# Patient Record
Sex: Female | Born: 1975
Health system: Southern US, Community
[De-identification: ages and names within clinical notes are randomized; demographics above are authoritative.]

## PROBLEM LIST (undated history)

## (undated) ENCOUNTER — Emergency Department (HOSPITAL_BASED_OUTPATIENT_CLINIC_OR_DEPARTMENT_OTHER): Admission: EM | Payer: Self-pay | Source: Home / Self Care

## (undated) DIAGNOSIS — E785 Hyperlipidemia, unspecified: Secondary | ICD-10-CM

## (undated) DIAGNOSIS — E0500 Thyrotoxicosis with diffuse goiter without thyrotoxic crisis or storm: Secondary | ICD-10-CM

## (undated) DIAGNOSIS — R0683 Snoring: Secondary | ICD-10-CM

## (undated) DIAGNOSIS — R5383 Other fatigue: Secondary | ICD-10-CM

## (undated) DIAGNOSIS — E039 Hypothyroidism, unspecified: Secondary | ICD-10-CM

## (undated) DIAGNOSIS — I10 Essential (primary) hypertension: Secondary | ICD-10-CM

## (undated) DIAGNOSIS — F32A Depression, unspecified: Secondary | ICD-10-CM

## (undated) DIAGNOSIS — F329 Major depressive disorder, single episode, unspecified: Secondary | ICD-10-CM

## (undated) DIAGNOSIS — F419 Anxiety disorder, unspecified: Secondary | ICD-10-CM

## (undated) HISTORY — DX: Essential (primary) hypertension: I10

## (undated) HISTORY — DX: Hypothyroidism, unspecified: E03.9

## (undated) HISTORY — DX: Thyrotoxicosis with diffuse goiter without thyrotoxic crisis or storm: E05.00

## (undated) HISTORY — DX: Snoring: R06.83

## (undated) HISTORY — DX: Other fatigue: R53.83

## (undated) HISTORY — DX: Hyperlipidemia, unspecified: E78.5

## (undated) HISTORY — DX: Depression, unspecified: F32.A

## (undated) HISTORY — DX: Anxiety disorder, unspecified: F41.9

---
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
## 1999-03-21 ENCOUNTER — Other Ambulatory Visit: Admission: RE | Admit: 1999-03-21 | Discharge: 1999-03-21 | Payer: Self-pay | Admitting: Obstetrics and Gynecology

## 2000-12-29 ENCOUNTER — Other Ambulatory Visit: Admission: RE | Admit: 2000-12-29 | Discharge: 2000-12-29 | Payer: Self-pay | Admitting: Obstetrics and Gynecology

## 2001-05-21 ENCOUNTER — Inpatient Hospital Stay (HOSPITAL_COMMUNITY): Admission: AD | Admit: 2001-05-21 | Discharge: 2001-05-21 | Payer: Self-pay | Admitting: Obstetrics & Gynecology

## 2001-08-02 ENCOUNTER — Inpatient Hospital Stay (HOSPITAL_COMMUNITY): Admission: AD | Admit: 2001-08-02 | Discharge: 2001-08-04 | Payer: Self-pay | Admitting: Obstetrics and Gynecology

## 2001-09-13 ENCOUNTER — Other Ambulatory Visit: Admission: RE | Admit: 2001-09-13 | Discharge: 2001-09-13 | Payer: Self-pay | Admitting: Obstetrics & Gynecology

## 2003-09-07 ENCOUNTER — Inpatient Hospital Stay (HOSPITAL_COMMUNITY): Admission: AD | Admit: 2003-09-07 | Discharge: 2003-09-07 | Payer: Self-pay | Admitting: Gynecology

## 2003-09-08 ENCOUNTER — Ambulatory Visit (HOSPITAL_COMMUNITY): Admission: RE | Admit: 2003-09-08 | Discharge: 2003-09-08 | Payer: Self-pay | Admitting: *Deleted

## 2003-09-21 ENCOUNTER — Encounter: Admission: RE | Admit: 2003-09-21 | Discharge: 2003-09-21 | Payer: Self-pay | Admitting: Obstetrics and Gynecology

## 2005-03-28 IMAGING — US US OB COMP LESS 14 WK
1 series · 18 of 28 positions shown · non-contrast
Comparison: none

CLINICAL DATA: 14 weeks estimated gestational age with spotting.  
EARLY OBSTETRICAL ULTRASOUND WITH TRANSVAGINAL:
Multiple images of the uterus and adnexa were obtained using a transabdominal and endovaginal approaches. 
There is an intrauterine gestational sac identified which has a sac size correlating with a 9 week 1 day gestation.  No evidence for yolk sac or fetal pole is seen and both should be easily visualized at the mean sac diameter of 3.6 cm noted today.  Findings are compatible with a blighted ovum.  
Both ovaries are seen and have a normal appearance with the right ovary measuring 2.0 x 2.1 x 1.6 cm and the left ovary measuring 2.5 x 1.5 x 2.3 cm.  No cul-de-sac or periovarian fluid is seen and no separate adnexal masses are noted.  
IMPRESSION
Findings compatible with a blighted ovum.

[Series 1: us ob comp<14 wk · 18 of 40 slices shown]
[im 1/40]
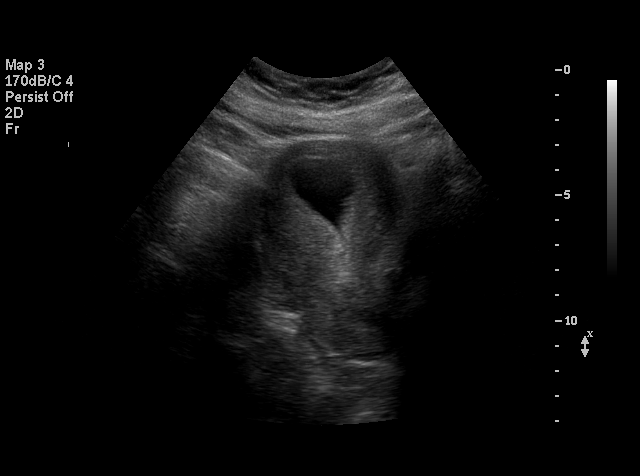
[im 3/40]
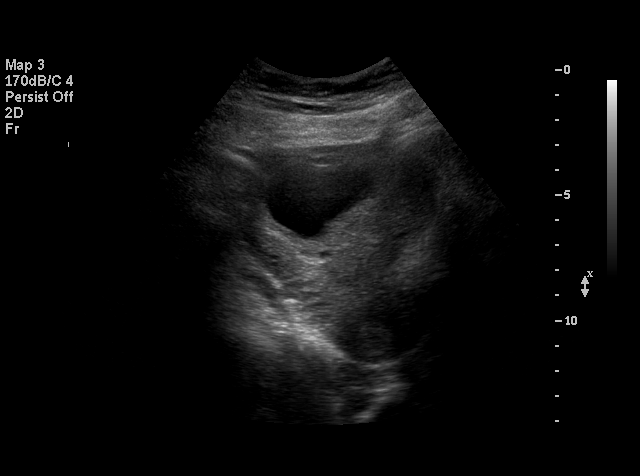
[im 5/40]
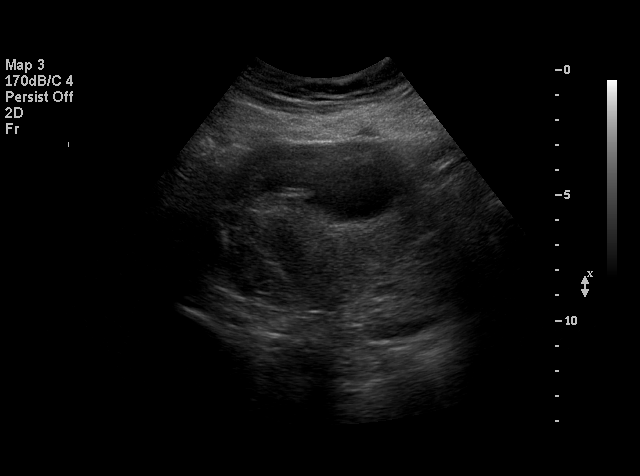
[im 8/40]
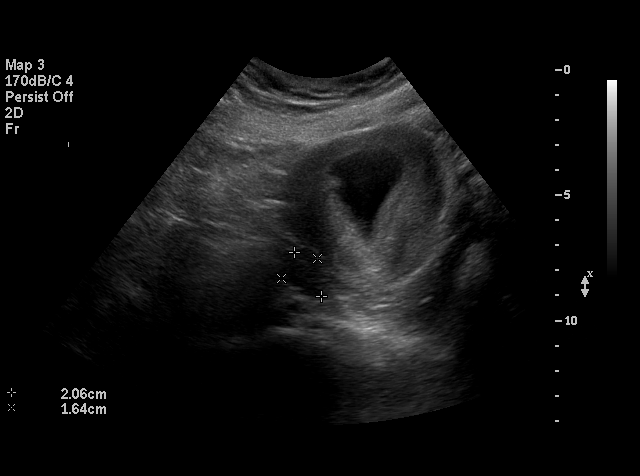
[im 11/40]
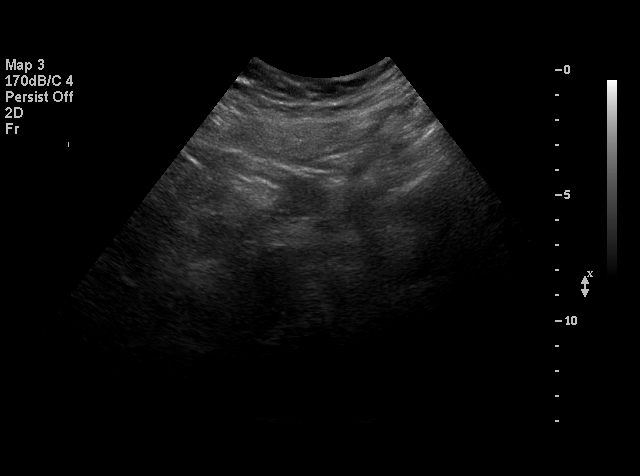
[im 12/40]
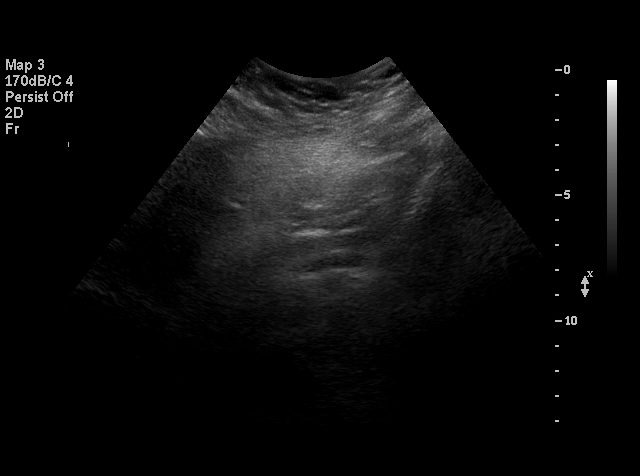
[im 15/40]
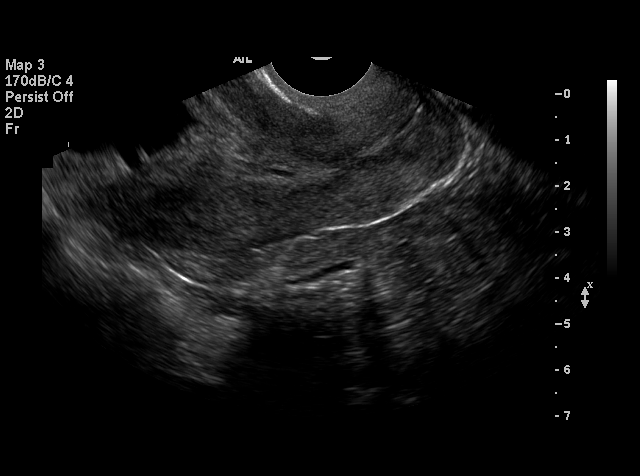
[im 16/40]
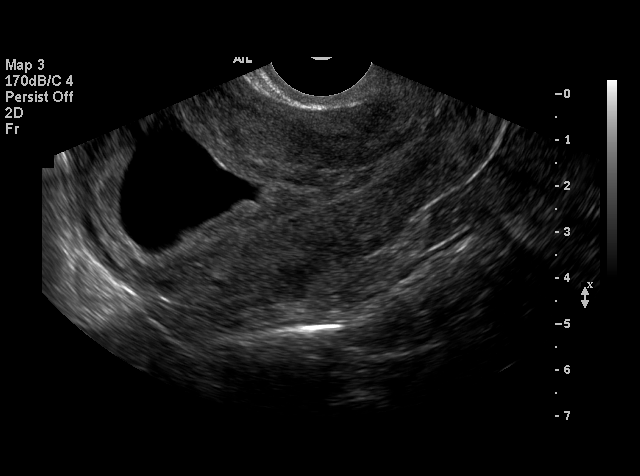
[im 19/40]
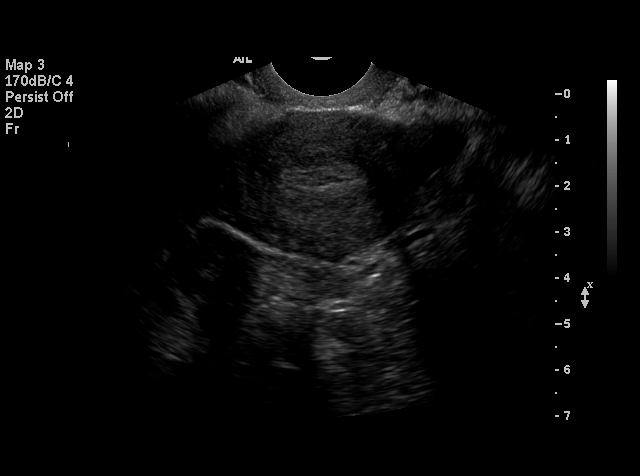
[im 21/40]
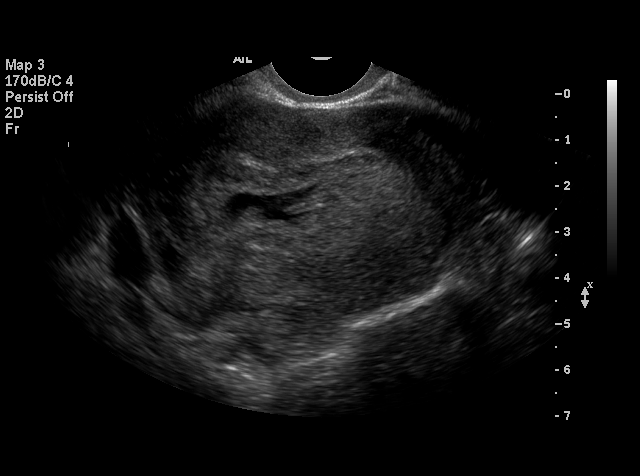
[im 24/40]
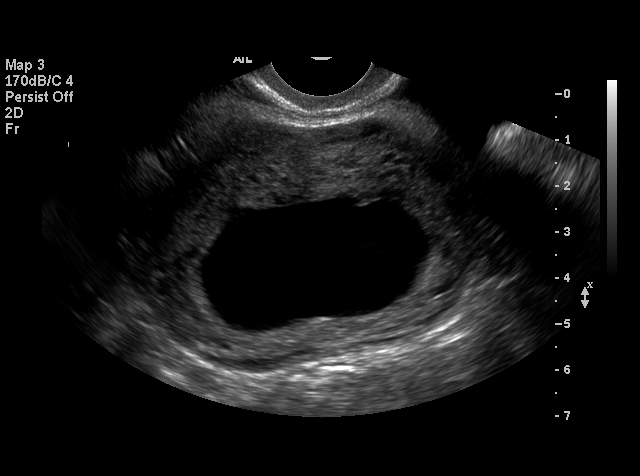
[im 25/40]
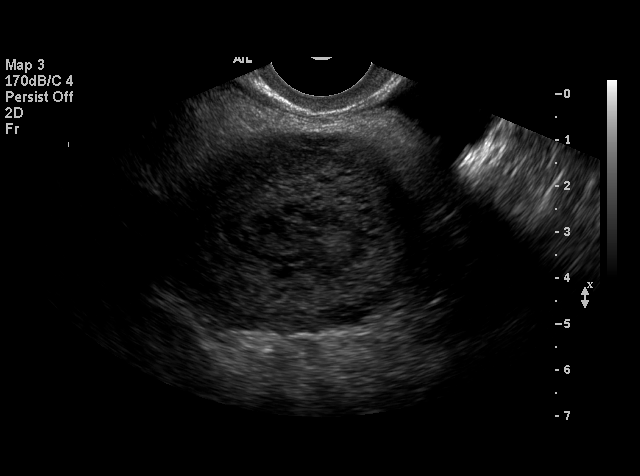
[im 28/40]
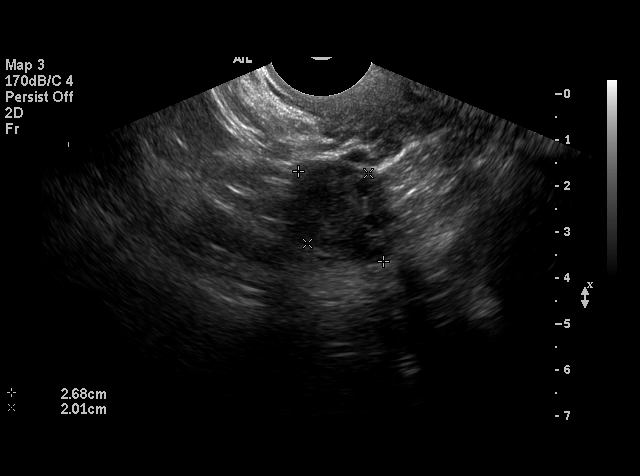
[im 31/40]
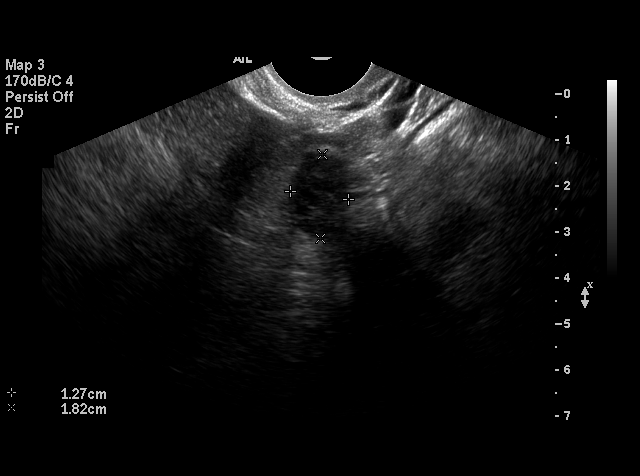
[im 32/40]
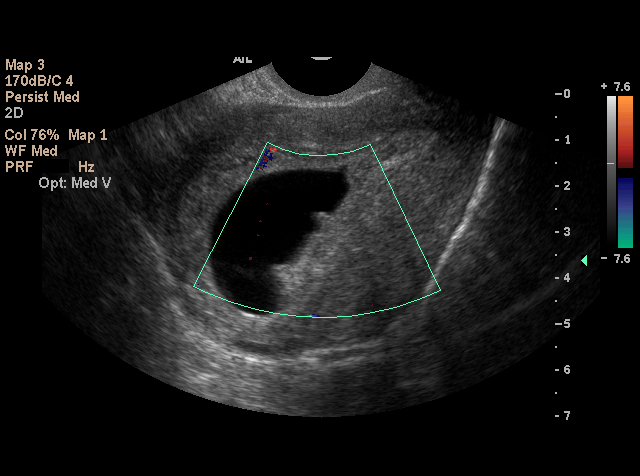
[im 35/40]
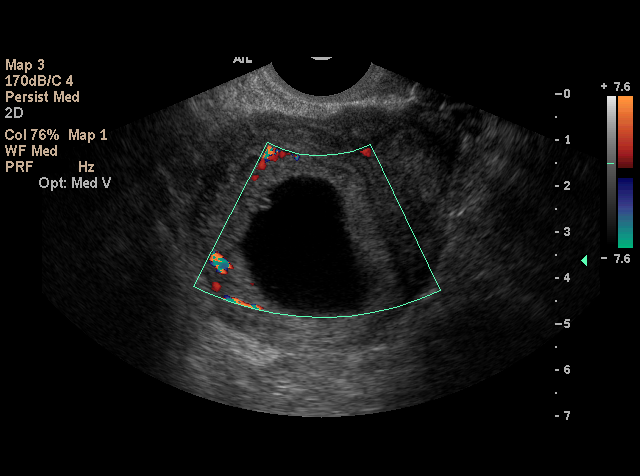
[im 37/40]
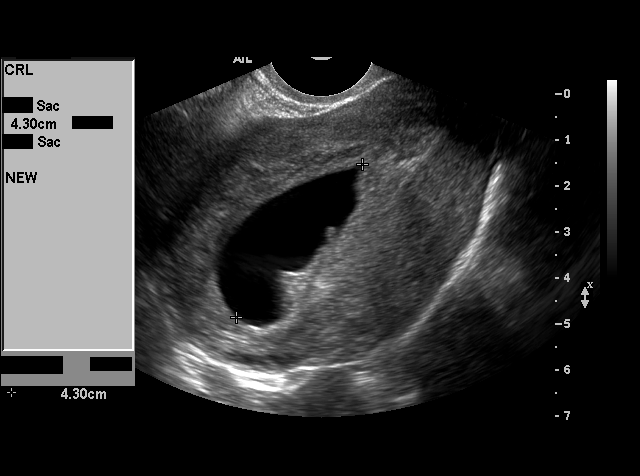
[im 40/40]
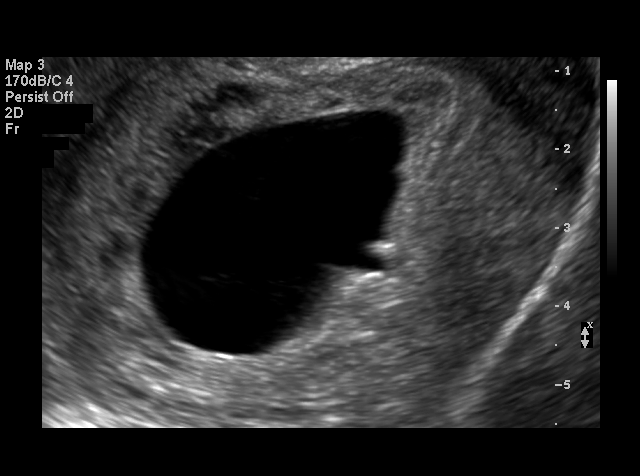

[18 of 28 positions shown; findings below may reference images not displayed]

## 2015-07-16 ENCOUNTER — Ambulatory Visit (INDEPENDENT_AMBULATORY_CARE_PROVIDER_SITE_OTHER): Payer: BLUE CROSS/BLUE SHIELD | Admitting: Neurology

## 2015-07-16 ENCOUNTER — Encounter: Payer: Self-pay | Admitting: Neurology

## 2015-07-16 VITALS — BP 130/92 | HR 82 | Resp 20 | Ht 61.0 in | Wt 224.0 lb

## 2015-07-16 DIAGNOSIS — E785 Hyperlipidemia, unspecified: Secondary | ICD-10-CM | POA: Insufficient documentation

## 2015-07-16 DIAGNOSIS — R351 Nocturia: Secondary | ICD-10-CM

## 2015-07-16 DIAGNOSIS — G4710 Hypersomnia, unspecified: Secondary | ICD-10-CM | POA: Diagnosis not present

## 2015-07-16 DIAGNOSIS — R0683 Snoring: Secondary | ICD-10-CM | POA: Diagnosis not present

## 2015-07-16 DIAGNOSIS — G4730 Sleep apnea, unspecified: Secondary | ICD-10-CM

## 2015-07-16 NOTE — Progress Notes (Signed)
[facility]   Provider:  Melvyn Novas, M D  Referring Provider: Geoffry Paradise, MD Primary Care Physician:  Samuel Jester, DO  Chief Complaint  Patient presents with  . New Patient (Initial Visit)    snoring, never had sleep study, rm 11, alone    HPI:  Angela Newman is a 40 y.o. female , seen here as a referral from Dr. Jacky Kindle for a sleep consultation,   Angela Newman is a right-handed Caucasian female patient of Dr. Lanell Matar, by whom she is followed for her thyroid disease. She is complaining of snoring and nonrestorative sleep and has a body mass index exceeding 35. She quit smoking in 2015, does drink up to 3 caffeinated sodas per day, denies any recreational drug use and alcohol use. She is not longer exposed to passive smoke . Her medical history includes Graves' thyroid disease, obesity, there is no surgical history,  she has a maternal history of headaches and hypertension maternal great grandmother with goiter paternal grandmother with history of cancer, strokes, diabetes heart attack and hypertension paternal grandfather with history of cancer. She is not employed since 2015, only in these circumstances could she finally quit smoking. She has looked for new work.   She is married with 2 children.  Her oldest son is 88, her daughter is in school, 37 years old. Urinary urgency and frequency started during the pregnancy was her second child, a daughter, about 14 years ago. Both children were born after an uneventful pregnancy by vaginal delivery. Chief complaint according to patient :She works out every day 30-45 minutes and feels right after very sleepy, sluggish.  " I am yawning while on the treat mill! "  Sleep habits are as follows: She usually goes to bed at 2300 hrs., falling asleep very promptly. The bedroom is cool quiet and dark, she shares a bedroom with her husband. Her husband has noted her to snore and has witnessed her to breathe irregularly. She sleeps  on her side, she rests her head on one pillow and chooses two other pillows to position herself. Her husband watched TV in the bedroom. The cable goes out at 3 AM and she wakes to shut the TV down.  Once asleep she will wake up every 2 hours to urinate. She does not recall waking for any other reasons than for bathroom break. She feels she goes right back to sleep, yet her sleep is fragmented. She rises in the morning at 6:30 AM and if she wakes up at 5 AM this nocturia she has trouble going back to sleep after that. She will have 4-5 bathroom breaks at night. She wakes without headaches but with a dry mouth. She has urinary frequency during the day to reports that the bladder volume is not reduced, she usually feels that her bladder was emptied conmpletely. She has not been diagnosed with diabetes but with thyroid condition. The urinary frequency has been present for years. The patient reports that she often wakes up just before the alarm will ring but she uses the alarm as a safety measure. She uses the alarm on her phone. When she rises she will drink some pepsi, she usually eats oatmeal in the morning and sometimes bacon and eggs. She is currently not employed outside the home.  Sleep medical history and family sleep history: the patient never had a problem to initiate sleep and maintain sleep, no history of sleepwalking, night terrors or enuresis Social history: see above   Review of  Systems: Out of a complete 14 system review, the patient complains of only the following symptoms, and all other reviewed systems are negative. How likely are you to doze in the following situations: 0 = not likely, 1 = slight chance, 2 = moderate chance, 3 = high chance  Sitting and Reading?2 Watching Television?2 Sitting inactive in a public place (theater or meeting)?2 As a passenger in a car for an hour without a break?3  Lying down in the afternoon when circumstances permit?3 Sitting and talking to  someone?0 Sitting quietly after lunch without alcohol?2 In a car, while stopped for a few minutes in traffic?1 Total = 15    , Fatigue severity score 34  , depression score 2/15   Social History   Social History  . Marital Status: Married    Spouse Name: N/A  . Number of Children: N/A  . Years of Education: N/A   Occupational History  . Not on file.   Social History Main Topics  . Smoking status: Former Smoker -- 1.00 packs/day    Types: Cigarettes    Quit date: 09/07/2013  . Smokeless tobacco: Not on file  . Alcohol Use: No  . Drug Use: No  . Sexual Activity: Not on file   Other Topics Concern  . Not on file   Social History Narrative   Drinks 2 cups of caffeine daily.    Family History  Problem Relation Age of Onset  . Headache Mother   . Hypertension Mother   . Stroke Mother   . Sleep apnea Mother   . Heart attack Father     Past Medical History  Diagnosis Date  . Graves disease   . Hypothyroid   . Hypertension   . Fatigue   . Snoring   . Hyperlipidemia     No past surgical history on file.  Current Outpatient Prescriptions  Medication Sig Dispense Refill  . ALPRAZolam (XANAX) 0.25 MG tablet Take 0.25 mg by mouth at bedtime as needed for anxiety.    Marland Kitchen atorvastatin (LIPITOR) 20 MG tablet Take 20 mg by mouth daily.    . citalopram (CELEXA) 20 MG tablet Take 20 mg by mouth daily.    Marland Kitchen HYDROcodone-acetaminophen (LORTAB) 7.5-500 MG tablet Take 1 tablet by mouth every 6 (six) hours as needed for pain.    Marland Kitchen levothyroxine (SYNTHROID, LEVOTHROID) 137 MCG tablet Take 137 mcg by mouth daily before breakfast.     No current facility-administered medications for this visit.    Allergies as of 07/16/2015  . (No Known Allergies)    Vitals: Ht 5\' 1"  (1.549 m)  Wt 224 lb (101.606 kg)  BMI 42.35 kg/m2 Last Weight:  Wt Readings from Last 1 Encounters:  07/16/15 224 lb (101.606 kg)   RUE:AVWU mass index is 42.35 kg/(m^2).     Last Height:   Ht Readings  from Last 1 Encounters:  07/16/15 5\' 1"  (1.549 m)    Physical exam:  General: The patient is awake, alert and appears not in acute distress. The patient is well groomed. Head: Normocephalic, atraumatic. Neck is supple. Mallampati 4  neck circumference:16. Nasal airflow patent , full dentures on top and bottom.  Cardiovascular:  Regular rate and rhythm, without  murmurs or carotid bruit, and without distended neck veins. Respiratory: Lungs are clear to auscultation. Skin:  Without evidence of edema, or rash Trunk: BMI is elevated - The patient's posture is hunched   Neurologic exam : The patient is awake and alert, oriented to  place and time.   Memory subjective described as intact.  Attention span & concentration ability appears normal.  Speech is fluent,  without dysarthria, dysphonia or aphasia.  Mood and affect are appropriate.  Cranial nerves: Pupils are equal and briskly reactive to light. Funduscopic exam without evidence of pallor or edema.  Extraocular movements  in vertical and horizontal planes intact and without nystagmus. Visual fields by finger perimetry are intact. Hearing to finger rub intact. Facial sensation intact to fine touch.Facial motor strength is symmetric and tongue and uvula move midline. Shoulder shrug was symmetrical.  Motor exam:  Normal tone, muscle bulk and symmetric strength in all extremities. Sensory:  Fine touch, pinprick and vibration were tested in all extremities. Proprioception tested in the upper extremities was normal. Coordination: Rapid alternating movements and finger-to-nose maneuver  normal without evidence of ataxia, dysmetria or tremor. Gait and station: Patient walks without assistive device and is able unassisted to climb up to the exam table. Strength within normal limits.Stance is stable and normal. Tandem gait is unfragmented. Turns with  3 Steps. Romberg testing is  negative. Deep tendon reflexes: in the  upper and lower extremities  are symmetric and intact. Babinski maneuver was deferred.  The patient was advised of the nature of the diagnosed sleep disorder , the treatment options and risks for general a health and wellness arising from not treating the condition.  I spent more than 40 minutes of face to face time with the patient. Greater than 50% of time was spent in counseling and coordination of care. We have discussed the diagnosis and differential and I answered the patient's questions.     Assessment:  After physical and neurologic examination, review of laboratory studies,  Personal review of imaging studies, reports of other /same  Imaging studies ,  Results of polysomnography/ neurophysiology testing and pre-existing records as far as provided in visit., my assessment is   1)  Angela Newman presents with witnessed apneas and snoring, likely related to her body mass index and her upper airway shape. She does have a neck circumference of 16 inches, which for woman indicates the presence of sleep apnea. She does have normal muscle tone and normal sensorium, normal coordination and gait. She has resumed a daily exercise regimen, she still struggles with her weight. Her muscle tone however is excellent. She has poor core strength.   2) her excessive daytime sleepiness can easily be related to apnea but also to the frequent nocturia. She has had urinary frequency ever since her pregnancy with her second child. She gained 60 pounds with every pregnancy.  3) Angela Newman has artificial teeth upper and lower, and if it should come to a selection of the CPAP interface we have to keep this in mind.  Plan:  Treatment plan and additional workup : Since the patient had quit smoking she put on about 50 pounds in less than 6 months, and she had to weight gain events before with each of her pregnancy. Her key factor here is the body mass index is the main risk factor for apnea. Apnea however is a possible risk factor for her nocturia.  I  will order a split night polysomnogram for here and she will be evaluated for apnea, PLMs and hypoxemia, snoring and irregular heart beats. She wakes sometimes feeling hot, having mood swings and hot flushes. She is using depo-provera and cannot follow her periods, may be she  is perimenopausal.   Melvyn Novas MD  07/16/2015  CC: Geoffry ParadiseRichard Aronson, Md [address] [place], [place] [id number]

## 2015-07-31 ENCOUNTER — Ambulatory Visit (INDEPENDENT_AMBULATORY_CARE_PROVIDER_SITE_OTHER): Payer: BLUE CROSS/BLUE SHIELD | Admitting: Neurology

## 2015-07-31 DIAGNOSIS — G4710 Hypersomnia, unspecified: Secondary | ICD-10-CM | POA: Diagnosis not present

## 2015-07-31 DIAGNOSIS — R351 Nocturia: Secondary | ICD-10-CM

## 2015-07-31 DIAGNOSIS — R0683 Snoring: Secondary | ICD-10-CM

## 2015-07-31 DIAGNOSIS — G4730 Sleep apnea, unspecified: Secondary | ICD-10-CM

## 2015-08-06 ENCOUNTER — Telehealth: Payer: Self-pay

## 2015-08-06 NOTE — Telephone Encounter (Signed)
I spoke to pt regarding her sleep study results. I advised her that her sleep study revealed snoring and weight loss is the primary treatment option. Alternative therapies may include: oral appliance or ENT evaluation/procedure. This study does not reveal significant sleep apnea or significant periodic limb movements of sleep resulting in significant sleep disruption. I advised pt to avoid caffeine containing beverages and chocolate. I advised pt to optimize pain control management in either pain clinic or primary care setting if the pt awakes from sleep with pain. Dr. Vickey Hugerohmeier recommends a referral to sleep psychology if insomnia is of clinical concern. Pt says that she does not have a problem with insomnia. I advised pt to avoid caffeine containing beverages and chocolate. Pt declined a follow up appt with Dr. Vickey Hugerohmeier at this time. Pt asked that a copy of her sleep study be faxed to Dr. Jacky KindleAronson. Pt verbalized understanding of results. Pt had no questions at this time but was encouraged to call back if questions arise.

## 2019-02-14 ENCOUNTER — Other Ambulatory Visit: Payer: Self-pay

## 2019-02-14 ENCOUNTER — Ambulatory Visit: Payer: BLUE CROSS/BLUE SHIELD | Admitting: Physician Assistant

## 2019-02-15 ENCOUNTER — Ambulatory Visit (INDEPENDENT_AMBULATORY_CARE_PROVIDER_SITE_OTHER): Payer: Commercial Managed Care - PPO | Admitting: Physician Assistant

## 2019-02-15 ENCOUNTER — Encounter: Payer: Self-pay | Admitting: Physician Assistant

## 2019-02-15 DIAGNOSIS — Z23 Encounter for immunization: Secondary | ICD-10-CM | POA: Diagnosis not present

## 2019-02-15 DIAGNOSIS — I10 Essential (primary) hypertension: Secondary | ICD-10-CM

## 2019-02-15 DIAGNOSIS — F339 Major depressive disorder, recurrent, unspecified: Secondary | ICD-10-CM | POA: Diagnosis not present

## 2019-02-15 DIAGNOSIS — F419 Anxiety disorder, unspecified: Secondary | ICD-10-CM | POA: Diagnosis not present

## 2019-02-15 DIAGNOSIS — E039 Hypothyroidism, unspecified: Secondary | ICD-10-CM

## 2019-02-15 MED ORDER — ALPRAZOLAM 0.25 MG PO TABS: 0.2500 mg | ORAL_TABLET | Freq: Every evening | ORAL | 2 refills | Status: DC | PRN

## 2019-02-15 MED ORDER — IBUPROFEN 200 MG PO CAPS: 800.0000 mg | ORAL_CAPSULE | Freq: Three times a day (TID) | ORAL | 0 refills | Status: DC

## 2019-02-20 DIAGNOSIS — F32A Depression, unspecified: Secondary | ICD-10-CM | POA: Insufficient documentation

## 2019-02-20 DIAGNOSIS — F339 Major depressive disorder, recurrent, unspecified: Secondary | ICD-10-CM | POA: Insufficient documentation

## 2019-02-20 DIAGNOSIS — F329 Major depressive disorder, single episode, unspecified: Secondary | ICD-10-CM | POA: Insufficient documentation

## 2019-02-20 DIAGNOSIS — E039 Hypothyroidism, unspecified: Secondary | ICD-10-CM | POA: Insufficient documentation

## 2019-02-20 DIAGNOSIS — F419 Anxiety disorder, unspecified: Secondary | ICD-10-CM | POA: Insufficient documentation

## 2019-02-20 DIAGNOSIS — I10 Essential (primary) hypertension: Secondary | ICD-10-CM | POA: Insufficient documentation

## 2019-02-20 NOTE — Progress Notes (Signed)
Acute Office Visit  Subjective:    Patient ID: Angela Newman, female    DOB: Sep 16, 1975, 44 y.o.   MRN: [id number]  Chief Complaint  Patient presents with  . New Patient (Initial Visit)    Dr. Melina Copa    Anxiety Presents for follow-up visit. Symptoms include nervous/anxious behavior. Patient reports no chest pain, decreased concentration, excessive worry, insomnia or irritability. The severity of symptoms is moderate. The quality of sleep is good. Nighttime awakenings: none.    Thyroid Problem Presents for follow-up visit. Symptoms include anxiety. Patient reports no cold intolerance, constipation or fatigue. Her past medical history is significant for hyperlipidemia.  Hyperlipidemia This is a chronic problem. The problem is controlled. Recent lipid tests were reviewed and are normal. Pertinent negatives include no chest pain. The current treatment provides moderate improvement of lipids.  Depression        This is a chronic problem.  The current episode started more than 1 year ago.   The onset quality is gradual.   The problem has been gradually improving since onset.  Associated symptoms include no decreased concentration, no fatigue, does not have insomnia and not irritable.  Past medical history includes thyroid problem and anxiety.      Past Medical History:  Diagnosis Date  . Anxiety   . Depression   . Fatigue   . Graves disease   . Hyperlipidemia   . Hypertension   . Hypothyroid   . Snoring     History reviewed. No pertinent surgical history.  Family History  Problem Relation Age of Onset  . Headache Mother   . Hypertension Mother   . Stroke Mother   . Sleep apnea Mother   . Cancer Mother        liver  . Heart attack Father   . Heart attack Sister   . Hypertension Sister   . Restless legs syndrome Sister   . Cancer Sister   . Lymphoma Sister   . Hypothyroidism Daughter     Social History   Socioeconomic History  . Marital status: Married    Spouse  name: Not on file  . Number of children: Not on file  . Years of education: Not on file  . Highest education level: Not on file  Occupational History  . Not on file  Tobacco Use  . Smoking status: Former Smoker    Packs/day: 1.00    Types: Cigarettes    Quit date: 09/07/2013    Years since quitting: 5.4  . Smokeless tobacco: Never Used  Substance and Sexual Activity  . Alcohol use: No    Alcohol/week: 0.0 standard drinks  . Drug use: No  . Sexual activity: Not on file  Other Topics Concern  . Not on file  Social History Narrative   Drinks 2 cups of caffeine daily.   Social Determinants of Health   Financial Resource Strain:   . Difficulty of Paying Living Expenses: Not on file  Food Insecurity:   . Worried About Charity fundraiser in the Last Year: Not on file  . Ran Out of Food in the Last Year: Not on file  Transportation Needs:   . Lack of Transportation (Medical): Not on file  . Lack of Transportation (Non-Medical): Not on file  Physical Activity:   . Days of Exercise per Week: Not on file  . Minutes of Exercise per Session: Not on file  Stress:   . Feeling of Stress : Not on file  Social  Connections:   . Frequency of Communication with Friends and Family: Not on file  . Frequency of Social Gatherings with Friends and Family: Not on file  . Attends Religious Services: Not on file  . Active Member of Clubs or Organizations: Not on file  . Attends Archivist Meetings: Not on file  . Marital Status: Not on file  Intimate Partner Violence:   . Fear of Current or Ex-Partner: Not on file  . Emotionally Abused: Not on file  . Physically Abused: Not on file  . Sexually Abused: Not on file    Outpatient Medications Prior to Visit  Medication Sig Dispense Refill  . atorvastatin (LIPITOR) 20 MG tablet Take 20 mg by mouth daily.    . citalopram (CELEXA) 20 MG tablet Take 20 mg by mouth daily.    Marland Kitchen levothyroxine (SYNTHROID, LEVOTHROID) 137 MCG tablet Take 137  mcg by mouth daily before breakfast.    . medroxyPROGESTERone Acetate 150 MG/ML SUSY Inject 1 mL into the muscle every 3 (three) months.     No facility-administered medications prior to visit.    No Known Allergies  Review of Systems  Constitutional: Negative.  Negative for fatigue and irritability.  HENT: Negative.   Eyes: Negative.   Respiratory: Negative.   Cardiovascular: Negative for chest pain.  Gastrointestinal: Negative.  Negative for constipation.  Endocrine: Negative for cold intolerance.  Genitourinary: Negative.   Psychiatric/Behavioral: Positive for depression. Negative for decreased concentration. The patient is nervous/anxious. The patient does not have insomnia.        Objective:    Physical Exam Constitutional:      General: She is not irritable.She is not in acute distress.    Appearance: Normal appearance. She is well-developed.  HENT:     Head: Normocephalic and atraumatic.  Cardiovascular:     Rate and Rhythm: Normal rate.  Pulmonary:     Effort: Pulmonary effort is normal.  Skin:    General: Skin is warm and dry.     Findings: No rash.  Neurological:     Mental Status: She is alert and oriented to person, place, and time.     Deep Tendon Reflexes: Reflexes are normal and symmetric.     BP 137/89   Pulse 90   Temp (!) 97 F (36.1 C) (Temporal)   Ht '5\' 1"'  (1.549 m)   Wt 240 lb (108.9 kg)   SpO2 98%   BMI 45.35 kg/m  Wt Readings from Last 3 Encounters:  02/15/19 240 lb (108.9 kg)  07/16/15 224 lb (101.6 kg)    Health Maintenance Due  Topic Date Due  . HIV Screening  04/05/1989  . TETANUS/TDAP  04/05/1993  . PAP SMEAR-Modifier  04/06/1995    There are no preventive care reminders to display for this patient.   No results found for: TSH No results found for: WBC, HGB, HCT, MCV, PLT No results found for: NA, K, CHLORIDE, CO2, GLUCOSE, BUN, CREATININE, BILITOT, ALKPHOS, AST, ALT, PROT, ALBUMIN, CALCIUM, ANIONGAP, EGFR, GFR No  results found for: CHOL No results found for: HDL No results found for: LDLCALC No results found for: TRIG No results found for: CHOLHDL No results found for: HGBA1C     Assessment & Plan:   Problem List Items Addressed This Visit      Cardiovascular and Mediastinum   Hypertension     Endocrine   Hypothyroid     Other   Anxiety   Relevant Medications   ALPRAZolam (XANAX) 0.25  MG tablet   Depression   Relevant Medications   ALPRAZolam (XANAX) 0.25 MG tablet    Other Visit Diagnoses    Need for immunization against influenza       Relevant Orders   Flu Vaccine QUAD 36+ mos IM (Completed)       Meds ordered this encounter  Medications  . Ibuprofen 200 MG CAPS    Sig: Take 4 capsules (800 mg total) by mouth 3 (three) times daily.    Dispense:  120 capsule    Refill:  0    Order Specific Question:   Supervising Provider    Answer:   Janora Norlander [[id number]]  . ALPRAZolam (XANAX) 0.25 MG tablet    Sig: Take 1 tablet (0.25 mg total) by mouth at bedtime as needed for anxiety.    Dispense:  60 tablet    Refill:  2    Order Specific Question:   Supervising Provider    Answer:   Janora Norlander [[id number]]     Terald Sleeper, PA-C

## 2019-02-21 ENCOUNTER — Telehealth: Payer: Self-pay | Admitting: Physician Assistant

## 2019-02-23 ENCOUNTER — Telehealth: Payer: Self-pay | Admitting: Physician Assistant

## 2019-02-23 ENCOUNTER — Other Ambulatory Visit: Payer: Self-pay | Admitting: Physician Assistant

## 2019-02-23 MED ORDER — POLYETHYLENE GLYCOL 3350 17 GM/SCOOP PO POWD: 17.0000 g | Freq: Two times a day (BID) | ORAL | 11 refills | Status: DC | PRN

## 2019-02-23 NOTE — Telephone Encounter (Signed)
Patient is constipated and would like a rx for miralax.

## 2019-02-23 NOTE — Telephone Encounter (Signed)
It is over the counter, ok to buy generic

## 2019-02-23 NOTE — Telephone Encounter (Signed)
Sent med

## 2019-02-23 NOTE — Telephone Encounter (Signed)
Is it ok to do as rx it is cheaper for her

## 2019-03-04 ENCOUNTER — Other Ambulatory Visit: Payer: Self-pay | Admitting: Physician Assistant

## 2019-03-27 ENCOUNTER — Other Ambulatory Visit: Payer: Self-pay | Admitting: Physician Assistant

## 2019-04-15 ENCOUNTER — Ambulatory Visit: Payer: Commercial Managed Care - PPO | Admitting: Physician Assistant

## 2019-04-18 ENCOUNTER — Ambulatory Visit: Payer: Commercial Managed Care - PPO | Admitting: Physician Assistant

## 2019-04-26 ENCOUNTER — Ambulatory Visit (INDEPENDENT_AMBULATORY_CARE_PROVIDER_SITE_OTHER): Payer: Commercial Managed Care - PPO | Admitting: Physician Assistant

## 2019-04-26 ENCOUNTER — Encounter: Payer: Self-pay | Admitting: Physician Assistant

## 2019-04-26 DIAGNOSIS — F419 Anxiety disorder, unspecified: Secondary | ICD-10-CM

## 2019-04-26 DIAGNOSIS — F339 Major depressive disorder, recurrent, unspecified: Secondary | ICD-10-CM

## 2019-04-26 DIAGNOSIS — M2550 Pain in unspecified joint: Secondary | ICD-10-CM

## 2019-04-26 MED ORDER — IBUPROFEN 800 MG PO TABS: 800.0000 mg | ORAL_TABLET | Freq: Three times a day (TID) | ORAL | 5 refills | Status: DC | PRN

## 2019-04-26 MED ORDER — CITALOPRAM HYDROBROMIDE 40 MG PO TABS: 20.0000 mg | ORAL_TABLET | Freq: Every day | ORAL | 5 refills | Status: DC

## 2019-04-26 NOTE — Progress Notes (Signed)
Telephone visit  Subjective: Angela Newman chronic conditions PCP: Remus Loffler, PA-C XIP:[id number] Angela Newman is a 44 y.o. female calls for telephone consult today. Patient provides verbal consent for consult held via phone.  Patient is identified with 2 separate identifiers.  At this time the entire area is on COVID-19 social distancing and stay home orders are in place.  Patient is of higher risk and therefore we are performing this by a virtual method.  Location of patient: home Location of provider: [facility] Others present for call: no   The patient reports that she is doing a lot better.  She has taken her medication well not having to use the Xanax much at all.  She did have her mother just get diagnosed with liver cancer.  So she was quite upset about it.  She has not had any other issues at this time.  She does need refills on some of her medication.  She does need a refill on ibuprofen for her different joint pains.  She uses it on an as needed basis.  GAD 7 : Generalized Anxiety Score 04/26/2019 02/15/2019  Nervous, Anxious, on Edge 2 2  Control/stop worrying 2 2  Worry too much - different things 1 2  Trouble relaxing 1 2  Restless 0 2  Easily annoyed or irritable 3 2  Afraid - awful might happen 2 2  Total GAD 7 Score 11 14  Anxiety Difficulty Somewhat difficult Very difficult    Depression screen [facility] 2/9 04/26/2019 02/15/2019  Decreased Interest 2 2  Down, Depressed, Hopeless 2 2  PHQ - 2 Score 4 4  Altered sleeping 1 2  Tired, decreased energy 2 2  Change in appetite 1 2  Feeling bad or failure about yourself  0 0  Trouble concentrating 1 0  Moving slowly or fidgety/restless 0 0  Suicidal thoughts 0 0  PHQ-9 Score 9 10  Difficult doing work/chores Not difficult at all Somewhat difficult      ROS: Per HPI  No Known Allergies Past Medical History:  Diagnosis Date  . Anxiety   . Depression   . Fatigue   . Graves disease   . Hyperlipidemia   . Hypertension   .  Hypothyroid   . Snoring     Current Outpatient Medications:  .  ALPRAZolam (XANAX) 0.25 MG tablet, Take 1 tablet (0.25 mg total) by mouth at bedtime as needed for anxiety., Disp: 60 tablet, Rfl: 2 .  atorvastatin (LIPITOR) 20 MG tablet, Take 20 mg by mouth daily., Disp: , Rfl:  .  citalopram (CELEXA) 40 MG tablet, Take 0.5 tablets (20 mg total) by mouth daily., Disp: 30 tablet, Rfl: 5 .  ibuprofen (ADVIL) 800 MG tablet, Take 1 tablet (800 mg total) by mouth every 8 (eight) hours as needed., Disp: 90 tablet, Rfl: 5 .  levothyroxine (SYNTHROID, LEVOTHROID) 137 MCG tablet, Take 137 mcg by mouth daily before breakfast., Disp: , Rfl:  .  medroxyPROGESTERone Acetate 150 MG/ML SUSY, Inject 1 mL into the muscle every 3 (three) months., Disp: , Rfl:  .  polyethylene glycol powder (GLYCOLAX/MIRALAX) 17 GM/SCOOP powder, Take 17 g by mouth 2 (two) times daily as needed., Disp: 850 g, Rfl: 11  Assessment/ Plan: 44 y.o. female   1. Anxiety - citalopram (CELEXA) 40 MG tablet; Take 0.5 tablets (20 mg total) by mouth daily.  Dispense: 30 tablet; Refill: 5  2. Recurrent major depressive disorder, remission status unspecified (HCC) - citalopram (CELEXA) 40 MG tablet;  Take 0.5 tablets (20 mg total) by mouth daily.  Dispense: 30 tablet; Refill: 5  3. Arthralgia, unspecified joint - ibuprofen (ADVIL) 800 MG tablet; Take 1 tablet (800 mg total) by mouth every 8 (eight) hours as needed.  Dispense: 90 tablet; Refill: 5   Return in about 2 months (around 06/26/2019) for recheck medications.  Continue all other maintenance medications as listed above.  I discussed the assessment and treatment plan with the patient. The patient was provided an opportunity to ask questions and all were answered. The patient agreed with the plan and demonstrated an understanding of the instructions.   The patient was advised to call back or seek an in-person evaluation if the symptoms worsen or if the condition fails to improve as  anticipated.   The above assessment and management plan was discussed with the patient. The patient verbalized understanding of and has agreed to the management plan. Patient is aware to call the clinic if symptoms persist or worsen. Patient is aware when to return to the clinic for a follow-up visit. Patient educated on when it is appropriate to go to the emergency department.    Start time: 2:50 PM End time: 3:02 PM  Meds ordered this encounter  Medications  . ibuprofen (ADVIL) 800 MG tablet    Sig: Take 1 tablet (800 mg total) by mouth every 8 (eight) hours as needed.    Dispense:  90 tablet    Refill:  5    Order Specific Question:   Supervising Provider    Answer:   Janora Norlander [[id number]]  . citalopram (CELEXA) 40 MG tablet    Sig: Take 0.5 tablets (20 mg total) by mouth daily.    Dispense:  30 tablet    Refill:  5    Order Specific Question:   Supervising Provider    Answer:   Janora Norlander [[id number]]    Particia Nearing PA-C [place] [phone]

## 2019-05-09 ENCOUNTER — Telehealth: Payer: Self-pay | Admitting: Physician Assistant

## 2019-05-09 DIAGNOSIS — F419 Anxiety disorder, unspecified: Secondary | ICD-10-CM

## 2019-05-09 DIAGNOSIS — F339 Major depressive disorder, recurrent, unspecified: Secondary | ICD-10-CM

## 2019-05-09 MED ORDER — CITALOPRAM HYDROBROMIDE 40 MG PO TABS: 40.0000 mg | ORAL_TABLET | Freq: Every day | ORAL | 1 refills | Status: DC

## 2019-05-09 NOTE — Telephone Encounter (Signed)
Patient had an appointment with Yetta Barre 04/26/19- please review and advise.

## 2019-05-09 NOTE — Telephone Encounter (Signed)
Sent citalopram at 40 mg for the patient

## 2019-05-09 NOTE — Telephone Encounter (Incomplete)
  Prescription Request  05/09/2019  What is the name of the medication or equipment? citalopram (CELEXA) 40 MG tablet/ pt said Lawanna Kobus changed this med from 20 mg to 40 mg. When she picked up rx she received 40 mg and it said to cut in half  Have you contacted your pharmacy to request a refill? (if applicable) ***  Which pharmacy would you like this sent to? [facility]   Patient notified that their request is being sent to the clinical staff for review and that they should receive a response within 2 business days.

## 2019-05-10 NOTE — Telephone Encounter (Signed)
Patient aware and verbalized understanding. °

## 2019-05-19 ENCOUNTER — Other Ambulatory Visit: Payer: Self-pay | Admitting: *Deleted

## 2019-05-19 DIAGNOSIS — F419 Anxiety disorder, unspecified: Secondary | ICD-10-CM

## 2019-05-19 DIAGNOSIS — F339 Major depressive disorder, recurrent, unspecified: Secondary | ICD-10-CM

## 2019-06-01 ENCOUNTER — Other Ambulatory Visit: Payer: Self-pay | Admitting: Family Medicine

## 2019-06-01 DIAGNOSIS — F339 Major depressive disorder, recurrent, unspecified: Secondary | ICD-10-CM

## 2019-06-01 DIAGNOSIS — F419 Anxiety disorder, unspecified: Secondary | ICD-10-CM

## 2019-08-30 ENCOUNTER — Other Ambulatory Visit: Payer: Self-pay | Admitting: Family Medicine

## 2019-08-30 DIAGNOSIS — F419 Anxiety disorder, unspecified: Secondary | ICD-10-CM

## 2019-08-30 DIAGNOSIS — F339 Major depressive disorder, recurrent, unspecified: Secondary | ICD-10-CM

## 2019-08-30 NOTE — Telephone Encounter (Signed)
Last OV 05/09/19 by Dione Plover. Last RF 06/01/19. Next OV not scheduled  30 day supply given today   ntbs for further refills

## 2019-09-12 ENCOUNTER — Other Ambulatory Visit: Payer: Self-pay | Admitting: Family Medicine

## 2019-09-12 DIAGNOSIS — F419 Anxiety disorder, unspecified: Secondary | ICD-10-CM

## 2019-09-12 DIAGNOSIS — F339 Major depressive disorder, recurrent, unspecified: Secondary | ICD-10-CM

## 2019-10-15 ENCOUNTER — Other Ambulatory Visit: Payer: Self-pay | Admitting: Family Medicine

## 2019-10-15 DIAGNOSIS — F339 Major depressive disorder, recurrent, unspecified: Secondary | ICD-10-CM

## 2019-10-15 DIAGNOSIS — F419 Anxiety disorder, unspecified: Secondary | ICD-10-CM

## 2019-10-20 ENCOUNTER — Other Ambulatory Visit: Payer: Self-pay | Admitting: Family Medicine

## 2019-10-20 DIAGNOSIS — F339 Major depressive disorder, recurrent, unspecified: Secondary | ICD-10-CM

## 2019-10-20 DIAGNOSIS — F419 Anxiety disorder, unspecified: Secondary | ICD-10-CM

## 2019-10-27 ENCOUNTER — Ambulatory Visit: Payer: Commercial Managed Care - PPO | Admitting: Nurse Practitioner

## 2019-11-01 ENCOUNTER — Ambulatory Visit (INDEPENDENT_AMBULATORY_CARE_PROVIDER_SITE_OTHER): Payer: Commercial Managed Care - PPO | Admitting: Nurse Practitioner

## 2019-11-01 ENCOUNTER — Encounter: Payer: Self-pay | Admitting: Nurse Practitioner

## 2019-11-01 ENCOUNTER — Other Ambulatory Visit: Payer: Self-pay

## 2019-11-01 VITALS — BP 120/81 | HR 82 | Temp 97.6°F | Resp 20 | Ht 61.0 in | Wt 241.0 lb

## 2019-11-01 DIAGNOSIS — I10 Essential (primary) hypertension: Secondary | ICD-10-CM

## 2019-11-01 DIAGNOSIS — F339 Major depressive disorder, recurrent, unspecified: Secondary | ICD-10-CM

## 2019-11-01 DIAGNOSIS — E782 Mixed hyperlipidemia: Secondary | ICD-10-CM | POA: Diagnosis not present

## 2019-11-01 DIAGNOSIS — F419 Anxiety disorder, unspecified: Secondary | ICD-10-CM

## 2019-11-01 DIAGNOSIS — Z23 Encounter for immunization: Secondary | ICD-10-CM

## 2019-11-01 MED ORDER — CITALOPRAM HYDROBROMIDE 40 MG PO TABS: 40.0000 mg | ORAL_TABLET | Freq: Every day | ORAL | 1 refills | Status: DC

## 2019-11-01 MED ORDER — MELATONIN 5 MG PO CAPS: 1.0000 | ORAL_CAPSULE | Freq: Every evening | ORAL | 0 refills | Status: DC | PRN

## 2019-11-01 MED ORDER — POLYETHYLENE GLYCOL 3350 17 GM/SCOOP PO POWD: 17.0000 g | Freq: Two times a day (BID) | ORAL | 11 refills | Status: DC | PRN

## 2019-11-01 MED ORDER — ATORVASTATIN CALCIUM 20 MG PO TABS: 20.0000 mg | ORAL_TABLET | Freq: Every day | ORAL | 1 refills | Status: DC

## 2019-11-01 NOTE — Assessment & Plan Note (Signed)
Patient is reporting well-managed hypothyroid symptoms. Patient is managed by an endocrinologist. Patient has an upcoming appointment and would not want labs checked today.

## 2019-11-01 NOTE — Patient Instructions (Signed)

## 2019-11-01 NOTE — Progress Notes (Signed)
Established Patient Office Visit  Subjective:  Patient ID: Angela Newman, female    DOB: Nov 07, 1975  Age: 44 y.o. MRN: [id number]  CC:  Chief Complaint  Patient presents with   Medication Refill    HPI Angela Newman presents for   Hyperlipidemia: Patient presents with hyperlipidemia.  She was tested because of elevated cholesterol.  Her last labs completed at [facility] office. Patient reports cholesterol is still elevated but could not remember exact number. negative. There is not a family history of hyperlipidemia. There is a family history of early ischemia heart disease.  Anxiety: Patient complains of anxiety disorder.  She has the following symptoms: insomnia. Onset of symptoms was approximately a few years ago, stable since that time. She denies current suicidal and homicidal ideation. Family history significant for no psychiatric illness.Possible organic causes contributing are: none. Risk factors: none Previous treatment includes Xanax and celexa.  She complains of the following side effects from the treatment: none.  Hypothyroidism: Patient presents for follow up evaluation of thyroid function. Symptoms have been stable, patient denies fatigue, weight changes, heat/cold intolerance, bowel/skin changes or CVS symptoms.    Previous thyroid studies include patient compliant with current medication, follows a low-cholesterol diet and follows up as directed.     Past Medical History:  Diagnosis Date   Anxiety    Depression    Fatigue    Graves disease    Hyperlipidemia    Hypertension    Hypothyroid    Snoring      Family History  Problem Relation Age of Onset   Headache Mother    Hypertension Mother    Stroke Mother    Sleep apnea Mother    Cancer Mother        liver   Heart attack Father    Heart attack Sister    Hypertension Sister    Restless legs syndrome Sister    Cancer Sister    Lymphoma Sister    Hypothyroidism Daughter     Social  History   Socioeconomic History   Marital status: Married    Spouse name: Not on file   Number of children: Not on file   Years of education: Not on file   Highest education level: Not on file  Occupational History   Not on file  Tobacco Use   Smoking status: Former Smoker    Packs/day: 1.00    Types: Cigarettes    Quit date: 09/07/2013    Years since quitting: 6.1   Smokeless tobacco: Never Used  Vaping Use   Vaping Use: Never used  Substance and Sexual Activity   Alcohol use: No    Alcohol/week: 0.0 standard drinks   Drug use: No   Sexual activity: Not on file  Other Topics Concern   Not on file  Social History Narrative   Drinks 2 cups of caffeine daily.   Social Determinants of Health   Financial Resource Strain:    Difficulty of Paying Living Expenses: Not on file  Food Insecurity:    Worried About Programme researcher, broadcasting/film/video in the Last Year: Not on file   [employer] of Food in the Last Year: Not on file  Transportation Needs:    Lack of Transportation (Medical): Not on file   Lack of Transportation (Non-Medical): Not on file  Physical Activity:    Days of Exercise per Week: Not on file   Minutes of Exercise per Session: Not on file  Stress:    Feeling of  Stress : Not on file  Social Connections:    Frequency of Communication with Friends and Family: Not on file   Frequency of Social Gatherings with Friends and Family: Not on file   Attends Religious Services: Not on file   Active Member of Clubs or Organizations: Not on file   Attends Banker Meetings: Not on file   Marital Status: Not on file  Intimate Partner Violence:    Fear of Current or Ex-Partner: Not on file   Emotionally Abused: Not on file   Physically Abused: Not on file   Sexually Abused: Not on file    Outpatient Medications Prior to Visit  Medication Sig Dispense Refill   ALPRAZolam (XANAX) 0.25 MG tablet Take 1 tablet (0.25 mg total) by mouth at bedtime  as needed for anxiety. 60 tablet 2   ibuprofen (ADVIL) 800 MG tablet Take 1 tablet (800 mg total) by mouth every 8 (eight) hours as needed. 90 tablet 5   levothyroxine (SYNTHROID, LEVOTHROID) 137 MCG tablet Take 137 mcg by mouth daily before breakfast.     atorvastatin (LIPITOR) 20 MG tablet Take 20 mg by mouth daily.     citalopram (CELEXA) 40 MG tablet Take 1 tablet (40 mg total) by mouth daily. Needs appointment for further refills 30 tablet 0   polyethylene glycol powder (GLYCOLAX/MIRALAX) 17 GM/SCOOP powder Take 17 g by mouth 2 (two) times daily as needed. 850 g 11   medroxyPROGESTERone Acetate 150 MG/ML SUSY Inject 1 mL into the muscle every 3 (three) months.     No facility-administered medications prior to visit.    No Known Allergies  ROS Review of Systems  Constitutional: Negative.   HENT: Negative.   Respiratory: Negative.   Cardiovascular: Negative.   Gastrointestinal: Negative.   Endocrine: Negative.   Genitourinary: Negative.   Musculoskeletal: Negative.   Skin: Negative.   Neurological: Negative.       Objective:    Physical Exam Vitals reviewed.  Constitutional:      Appearance: Normal appearance.  HENT:     Head: Normocephalic.  Eyes:     Conjunctiva/sclera: Conjunctivae normal.  Cardiovascular:     Rate and Rhythm: Normal rate and regular rhythm.     Pulses: Normal pulses.     Heart sounds: Normal heart sounds.  Pulmonary:     Effort: Pulmonary effort is normal.     Breath sounds: Normal breath sounds.  Abdominal:     General: Bowel sounds are normal.  Musculoskeletal:        General: Normal range of motion.  Skin:    General: Skin is warm.  Neurological:     Mental Status: She is alert and oriented to person, place, and time.  Psychiatric:        Mood and Affect: Mood normal.        Behavior: Behavior normal.     BP 120/81    Pulse 82    Temp 97.6 F (36.4 C) (Temporal)    Resp 20    Ht 5\' 1"  (1.549 m)    Wt 241 lb (109.3 kg)     SpO2 99%    BMI 45.54 kg/m  Wt Readings from Last 3 Encounters:  11/01/19 241 lb (109.3 kg)  02/15/19 240 lb (108.9 kg)  07/16/15 224 lb (101.6 kg)     Health Maintenance Due  Topic Date Due   Hepatitis C Screening  Never done   COVID-19 Vaccine (1) Never done   HIV Screening  Never done   TETANUS/TDAP  Never done   PAP SMEAR-Modifier  Never done     Assessment & Plan:   Problem List Items Addressed This Visit      Cardiovascular and Mediastinum   Hypertension   Relevant Medications   atorvastatin (LIPITOR) 20 MG tablet   Other Relevant Orders   Lipid Panel   CBC with Differential     Other   Hyperlipidemia - Primary    Patient hyperlipidemia is well managed with current medication.  Patient reports lipid panels were drawn at her OB/GYN office she reports no signs or symptoms of hyperlipidemia.  She has been taking medication as directed, following a healthy low-cholesterol diet and exercise.  And following up as directed. Rx refill sent to pharmacy Repeat lipid panel drawn today pending lab results.      Relevant Medications   atorvastatin (LIPITOR) 20 MG tablet   Anxiety    Patient is a 44 year old female who presents to clinic for anxiety disorder.  She has no symptoms of worsening anxiety.  Patient was diagnosed a few years ago and started on Xanax 0.25 mg daily as needed.  Provided education to patient to use different type of medication for anxiety like BuSpar which patient is willing to try and trazodone for sleep.  Patient reports lately she has been feeling a little  edgy because she finished her Celexa and had no refill in the last 2 weeks.  Patient is willing to restart her Celexa, hold off on the Xanax, follow-up in 4 weeks and start melatonin 5 mg for sleep. Provided printed handouts Rx sent to pharmacy.      Relevant Medications   citalopram (CELEXA) 40 MG tablet   Depression   Relevant Medications   citalopram (CELEXA) 40 MG tablet   Need for  immunization against influenza   Relevant Orders   Flu Vaccine QUAD 36+ mos IM (Completed)      Meds ordered this encounter  Medications   citalopram (CELEXA) 40 MG tablet    Sig: Take 1 tablet (40 mg total) by mouth daily. Needs appointment for further refills    Dispense:  30 tablet    Refill:  1    Order Specific Question:   Supervising Provider    Answer:   Arville Care A [[id number]]   polyethylene glycol powder (GLYCOLAX/MIRALAX) 17 GM/SCOOP powder    Sig: Take 17 g by mouth 2 (two) times daily as needed.    Dispense:  850 g    Refill:  11    Order Specific Question:   Supervising Provider    Answer:   Arville Care A [[id number]]   atorvastatin (LIPITOR) 20 MG tablet    Sig: Take 1 tablet (20 mg total) by mouth daily.    Dispense:  30 tablet    Refill:  1    Order Specific Question:   Supervising Provider    Answer:   Arville Care A [[id number]]   Melatonin 5 MG CAPS    Sig: Take 1 capsule (5 mg total) by mouth at bedtime as needed (insomnia).    Dispense:  60 capsule    Refill:  0    Order Specific Question:   Supervising Provider    Answer:   Arville Care A [[id number]]    Follow-up: No follow-ups on file.    Daryll Drown, NP

## 2019-11-01 NOTE — Assessment & Plan Note (Signed)
Patient hyperlipidemia is well managed with current medication.  Patient reports lipid panels were drawn at her OB/GYN office she reports no signs or symptoms of hyperlipidemia.  She has been taking medication as directed, following a healthy low-cholesterol diet and exercise.  And following up as directed. Rx refill sent to pharmacy Repeat lipid panel drawn today pending lab results.

## 2019-11-01 NOTE — Assessment & Plan Note (Signed)
Patient is a 44 year old female who presents to clinic for anxiety disorder.  She has no symptoms of worsening anxiety.  Patient was diagnosed a few years ago and started on Xanax 0.25 mg daily as needed.  Provided education to patient to use different type of medication for anxiety like BuSpar which patient is willing to try and trazodone for sleep.  Patient reports lately she has been feeling a little  edgy because she finished her Celexa and had no refill in the last 2 weeks.  Patient is willing to restart her Celexa, hold off on the Xanax, follow-up in 4 weeks and start melatonin 5 mg for sleep. Provided printed handouts Rx sent to pharmacy.

## 2019-11-02 LAB — CBC WITH DIFFERENTIAL/PLATELET
Basophils Absolute: 0.1 10*3/uL (ref 0.0–0.2)
Basos: 1 %
EOS (ABSOLUTE): 0.2 10*3/uL (ref 0.0–0.4)
Eos: 2 %
Hematocrit: 32.1 % — ABNORMAL LOW (ref 34.0–46.6)
Hemoglobin: 9.2 g/dL — ABNORMAL LOW (ref 11.1–15.9)
Immature Grans (Abs): 0 10*3/uL (ref 0.0–0.1)
Immature Granulocytes: 0 %
Lymphocytes Absolute: 1.7 10*3/uL (ref 0.7–3.1)
Lymphs: 20 %
MCH: 19.5 pg — ABNORMAL LOW (ref 26.6–33.0)
MCHC: 28.7 g/dL — ABNORMAL LOW (ref 31.5–35.7)
MCV: 68 fL — ABNORMAL LOW (ref 79–97)
Monocytes Absolute: 0.5 10*3/uL (ref 0.1–0.9)
Monocytes: 6 %
Neutrophils Absolute: 5.9 10*3/uL (ref 1.4–7.0)
Neutrophils: 71 %
Platelets: 333 10*3/uL (ref 150–450)
RBC: 4.73 x10E6/uL (ref 3.77–5.28)
RDW: 19.1 % — ABNORMAL HIGH (ref 11.7–15.4)
WBC: 8.3 10*3/uL (ref 3.4–10.8)

## 2019-11-02 LAB — LIPID PANEL
Chol/HDL Ratio: 4 ratio (ref 0.0–4.4)
Cholesterol, Total: 144 mg/dL (ref 100–199)
HDL: 36 mg/dL — ABNORMAL LOW (ref >39)
LDL Chol Calc (NIH): 95 mg/dL (ref 0–99)
Triglycerides: 63 mg/dL (ref 0–149)
VLDL Cholesterol Cal: 13 mg/dL (ref 5–40)

## 2019-11-03 ENCOUNTER — Other Ambulatory Visit: Payer: Self-pay | Admitting: Nurse Practitioner

## 2019-11-03 DIAGNOSIS — R710 Precipitous drop in hematocrit: Secondary | ICD-10-CM

## 2019-11-03 MED ORDER — IRON (FERROUS SULFATE) 325 (65 FE) MG PO TABS: 1.0000 | ORAL_TABLET | Freq: Every day | ORAL | 0 refills | Status: DC

## 2019-11-03 MED ORDER — FERROUS SULFATE 325 (65 FE) MG PO TABS: 325.0000 mg | ORAL_TABLET | Freq: Two times a day (BID) | ORAL | 0 refills | Status: DC

## 2019-12-22 ENCOUNTER — Other Ambulatory Visit: Payer: Self-pay | Admitting: Family Medicine

## 2019-12-22 ENCOUNTER — Other Ambulatory Visit: Payer: Self-pay | Admitting: *Deleted

## 2019-12-22 DIAGNOSIS — F339 Major depressive disorder, recurrent, unspecified: Secondary | ICD-10-CM

## 2019-12-22 DIAGNOSIS — F419 Anxiety disorder, unspecified: Secondary | ICD-10-CM

## 2019-12-22 MED ORDER — MELATONIN 5 MG PO CAPS: 1.0000 | ORAL_CAPSULE | Freq: Every evening | ORAL | 1 refills | Status: DC | PRN

## 2020-01-23 ENCOUNTER — Other Ambulatory Visit: Payer: Self-pay

## 2020-01-23 MED ORDER — IRON (FERROUS SULFATE) 325 (65 FE) MG PO TABS: 1.0000 | ORAL_TABLET | Freq: Every day | ORAL | 0 refills | Status: DC

## 2020-02-09 ENCOUNTER — Encounter: Payer: Self-pay | Admitting: Nurse Practitioner

## 2020-02-09 ENCOUNTER — Other Ambulatory Visit: Payer: Self-pay

## 2020-02-09 ENCOUNTER — Ambulatory Visit: Payer: Commercial Managed Care - PPO | Admitting: Nurse Practitioner

## 2020-02-09 VITALS — BP 124/74 | HR 82 | Temp 96.7°F | Ht 61.0 in | Wt 240.4 lb

## 2020-02-09 DIAGNOSIS — Z6841 Body Mass Index (BMI) 40.0 and over, adult: Secondary | ICD-10-CM | POA: Diagnosis not present

## 2020-02-09 DIAGNOSIS — R710 Precipitous drop in hematocrit: Secondary | ICD-10-CM

## 2020-02-09 LAB — HEMOGLOBIN, FINGERSTICK: Hemoglobin: 12.9 g/dL (ref 11.1–15.9)

## 2020-02-09 MED ORDER — OZEMPIC (0.25 OR 0.5 MG/DOSE) 2 MG/1.5ML ~~LOC~~ SOPN: 0.5000 mg | PEN_INJECTOR | SUBCUTANEOUS | 2 refills | Status: DC

## 2020-02-09 NOTE — Assessment & Plan Note (Signed)
Hemoglobin well-controlled on current medication ferrous sulfate 325 mg tablet daily.  Hemoglobin today is 12.9G/DL from 9.2.G/DL  Follow-up in 3 months repeat hemoglobin.

## 2020-02-09 NOTE — Patient Instructions (Signed)
Anemia  Anemia is a condition in which you do not have enough red blood cells or hemoglobin. Hemoglobin is a substance in red blood cells that carries oxygen. When you do not have enough red blood cells or hemoglobin (are anemic), your body cannot get enough oxygen and your organs may not work properly. As a result, you may feel very tired or have other problems. What are the causes? Common causes of anemia include:  Excessive bleeding. Anemia can be caused by excessive bleeding inside or outside the body, including bleeding from the intestine or from periods in women.  Poor nutrition.  Long-lasting (chronic) kidney, thyroid, and liver disease.  Bone marrow disorders.  Cancer and treatments for cancer.  HIV (human immunodeficiency virus) and AIDS (acquired immunodeficiency syndrome).  Treatments for HIV and AIDS.  Spleen problems.  Blood disorders.  Infections, medicines, and autoimmune disorders that destroy red blood cells. What are the signs or symptoms? Symptoms of this condition include:  Minor weakness.  Dizziness.  Headache.  Feeling heartbeats that are irregular or faster than normal (palpitations).  Shortness of breath, especially with exercise.  Paleness.  Cold sensitivity.  Indigestion.  Nausea.  Difficulty sleeping.  Difficulty concentrating. Symptoms may occur suddenly or develop slowly. If your anemia is mild, you may not have symptoms. How is this diagnosed? This condition is diagnosed based on:  Blood tests.  Your medical history.  A physical exam.  Bone marrow biopsy. Your health care provider may also check your stool (feces) for blood and may do additional testing to look for the cause of your bleeding. You may also have other tests, including:  Imaging tests, such as a CT scan or MRI.  Endoscopy.  Colonoscopy. How is this treated? Treatment for this condition depends on the cause. If you continue to lose a lot of blood, you may  need to be treated at a hospital. Treatment may include:  Taking supplements of iron, vitamin S31, or folic acid.  Taking a hormone medicine (erythropoietin) that can help to stimulate red blood cell growth.  Having a blood transfusion. This may be needed if you lose a lot of blood.  Making changes to your diet.  Having surgery to remove your spleen. Follow these instructions at home:  Take over-the-counter and prescription medicines only as told by your health care provider.  Take supplements only as told by your health care provider.  Follow any diet instructions that you were given.  Keep all follow-up visits as told by your health care provider. This is important. Contact a health care provider if:  You develop new bleeding anywhere in the body. Get help right away if:  You are very weak.  You are short of breath.  You have pain in your abdomen or chest.  You are dizzy or feel faint.  You have trouble concentrating.  You have bloody or black, tarry stools.  You vomit repeatedly or you vomit up blood. Summary  Anemia is a condition in which you do not have enough red blood cells or enough of a substance in your red blood cells that carries oxygen (hemoglobin).  Symptoms may occur suddenly or develop slowly.  If your anemia is mild, you may not have symptoms.  This condition is diagnosed with blood tests as well as a medical history and physical exam. Other tests may be needed.  Treatment for this condition depends on the cause of the anemia. This information is not intended to replace advice given to you by  your health care provider. Make sure you discuss any questions you have with your health care provider. Document Revised: 01/09/2017 Document Reviewed: 02/29/2016 Elsevier Patient Education  [place].

## 2020-02-09 NOTE — Progress Notes (Addendum)
Established Patient Office Visit  Subjective:  Patient ID: Angela Newman, female    DOB: 11/13/1975  Age: 44 y.o. MRN: [id number]  CC:  Chief Complaint  Patient presents with  . Follow-up    Mood/ hemoglobin    HPI Angela Newman presents for follow-up decreased hemoglobin.  Last hemoglobin 9.2.  Patient hemoglobin today is 12.9.  Patient is compliant with current treatment regimen and following up as directed.  Current medication ferrous sulfate 325 mg tablet by mouth daily.  BMI greater than 40 Patient continues to report challenges with weight loss .  Current total body weight 240 pounds.  BMI 45.42 kg/mm.  She reported trying different diets in the past with no effect.  Patient is willing and wants to try medication for weight loss.    BMI Metric Follow Up - 02/09/20 1553      BMI Metric Follow Up-Please document annually   BMI Metric Follow Up Education provided            Past Medical History:  Diagnosis Date  . Anxiety   . Depression   . Fatigue   . Graves disease   . Hyperlipidemia   . Hypertension   . Hypothyroid   . Snoring     History reviewed. No pertinent surgical history.  Family History  Problem Relation Age of Onset  . Headache Mother   . Hypertension Mother   . Stroke Mother   . Sleep apnea Mother   . Cancer Mother        liver  . Heart attack Father   . Heart attack Sister   . Hypertension Sister   . Restless legs syndrome Sister   . Cancer Sister   . Lymphoma Sister   . Hypothyroidism Daughter     Social History   Socioeconomic History  . Marital status: Married    Spouse name: Not on file  . Number of children: Not on file  . Years of education: Not on file  . Highest education level: Not on file  Occupational History  . Not on file  Tobacco Use  . Smoking status: Former Smoker    Packs/day: 1.00    Types: Cigarettes    Quit date: 09/07/2013    Years since quitting: 6.4  . Smokeless tobacco: Never Used  Vaping Use  .  Vaping Use: Never used  Substance and Sexual Activity  . Alcohol use: No    Alcohol/week: 0.0 standard drinks  . Drug use: No  . Sexual activity: Not on file  Other Topics Concern  . Not on file  Social History Narrative   Drinks 2 cups of caffeine daily.   Social Determinants of Health   Financial Resource Strain: Not on file  Food Insecurity: Not on file  Transportation Needs: Not on file  Physical Activity: Not on file  Stress: Not on file  Social Connections: Not on file  Intimate Partner Violence: Not on file    Outpatient Medications Prior to Visit  Medication Sig Dispense Refill  . atorvastatin (LIPITOR) 20 MG tablet Take 1 tablet (20 mg total) by mouth daily. 30 tablet 1  . citalopram (CELEXA) 40 MG tablet Take 1 tablet (40 mg total) by mouth daily. 30 tablet 1  . ibuprofen (ADVIL) 800 MG tablet Take 1 tablet (800 mg total) by mouth every 8 (eight) hours as needed. 90 tablet 5  . Iron, Ferrous Sulfate, 325 (65 Fe) MG TABS Take 1 tablet by mouth daily. With food/orange juice  90 tablet 0  . levothyroxine (SYNTHROID, LEVOTHROID) 137 MCG tablet Take 137 mcg by mouth daily before breakfast.    . Melatonin 5 MG CAPS Take 1 capsule (5 mg total) by mouth at bedtime as needed (insomnia). 60 capsule 1  . polyethylene glycol powder (GLYCOLAX/MIRALAX) 17 GM/SCOOP powder Take 17 g by mouth 2 (two) times daily as needed. 850 g 11  . ALPRAZolam (XANAX) 0.25 MG tablet Take 1 tablet (0.25 mg total) by mouth at bedtime as needed for anxiety. (Patient not taking: Reported on 02/09/2020) 60 tablet 2   No facility-administered medications prior to visit.    No Known Allergies  ROS Review of Systems  Psychiatric/Behavioral: Negative for confusion, self-injury, sleep disturbance and suicidal ideas. The patient is nervous/anxious.   All other systems reviewed and are negative.     Objective:    Physical Exam Vitals reviewed.  Constitutional:      Appearance: Normal appearance.   HENT:     Head: Normocephalic.  Eyes:     Conjunctiva/sclera: Conjunctivae normal.  Cardiovascular:     Rate and Rhythm: Normal rate and regular rhythm.     Pulses: Normal pulses.     Heart sounds: Normal heart sounds.  Pulmonary:     Effort: Pulmonary effort is normal.     Breath sounds: Normal breath sounds.  Abdominal:     General: Bowel sounds are normal.  Skin:    General: Skin is warm.  Neurological:     Mental Status: She is alert.  Psychiatric:     Comments: Depression/anxiety     BP 124/74   Pulse 82   Temp (!) 96.7 F (35.9 C)   Ht '5\' 1"'  (1.549 m)   Wt 240 lb 6.4 oz (109 kg)   SpO2 98%   BMI 45.42 kg/m  Wt Readings from Last 3 Encounters:  02/09/20 240 lb 6.4 oz (109 kg)  11/01/19 241 lb (109.3 kg)  02/15/19 240 lb (108.9 kg)     Health Maintenance Due  Topic Date Due  . Hepatitis C Screening  Never done  . COVID-19 Vaccine (1) Never done  . HIV Screening  Never done  . TETANUS/TDAP  Never done  . PAP SMEAR-Modifier  Never done  . COLONOSCOPY (Pts 45-25yr Insurance coverage will need to be confirmed)  Never done    There are no preventive care reminders to display for this patient.  No results found for: TSH Lab Results  Component Value Date   WBC 8.3 11/01/2019   HGB 9.2 (L) 11/01/2019   HCT 32.1 (L) 11/01/2019   MCV 68 (L) 11/01/2019   PLT 333 11/01/2019   No results found for: NA, K, CHLORIDE, CO2, GLUCOSE, BUN, CREATININE, BILITOT, ALKPHOS, AST, ALT, PROT, ALBUMIN, CALCIUM, ANIONGAP, EGFR, GFR Lab Results  Component Value Date   CHOL 144 11/01/2019   Lab Results  Component Value Date   HDL 36 (L) 11/01/2019   Lab Results  Component Value Date   LDLCALC 95 11/01/2019   Lab Results  Component Value Date   TRIG 63 11/01/2019   Lab Results  Component Value Date   CHOLHDL 4.0 11/01/2019   No results found for: HGBA1C     GAD 7 : Generalized Anxiety Score 02/09/2020 04/26/2019 02/15/2019  Nervous, Anxious, on Edge '1 2 2   ' Control/stop worrying 0 2 2  Worry too much - different things 0 1 2  Trouble relaxing 0 1 2  Restless 0 0 2  Easily annoyed or  irritable '1 3 2  ' Afraid - awful might happen '2 2 2  ' Total GAD 7 Score '4 11 14  ' Anxiety Difficulty Not difficult at all Somewhat difficult Very difficult   [place] Office Visit from 02/09/2020 in [place]  PHQ-9 Total Score 6      Assessment & Plan:   Problem List Items Addressed This Visit      Other   Decreased hemoglobin - Primary   Relevant Orders   Hemoglobin, fingerstick (Completed)   BMI 40.0-44.9, adult (HCC)      No orders of the defined types were placed in this encounter.   Follow-up: No follow-ups on file.    Ivy Lynn, NP

## 2020-02-09 NOTE — Assessment & Plan Note (Signed)
BMI not well controlled.  Advised patient on calorie restrictions, weight loss and exercise regimen as tolerated. Ozempic ordered pending insurance approval.  Rx sent to pharmacy

## 2020-02-13 ENCOUNTER — Telehealth: Payer: Self-pay | Admitting: *Deleted

## 2020-02-13 NOTE — Telephone Encounter (Signed)
PA completed through [email or web address] Prior Auth (EOC) ID: [id number] Drug/Service Name: OZEMPIC 0.25-0.5 MG/DOSE PEN Patient: Marnette Burgess Date Requested: 02/13/2020 11:16:13 AM   MemberID: [id number] DOB: 1975-03-23

## 2020-02-16 NOTE — Telephone Encounter (Signed)
Prior auth for ozempic was denied. Patient must try,  Fail, or have a intolerance/contraindication to one of the following metformin, metformin ER, glipizide/metformin, glyburide/metformin, or pioglitazone/metformin.

## 2020-02-16 NOTE — Telephone Encounter (Signed)
Patient was already told that insurance will most likely deny this medication for weight loss purposes. referral has been made to [facility] weight loss center.  Angela Newman spoke to patient during visit.

## 2020-02-17 ENCOUNTER — Other Ambulatory Visit: Payer: Self-pay | Admitting: *Deleted

## 2020-02-17 DIAGNOSIS — F339 Major depressive disorder, recurrent, unspecified: Secondary | ICD-10-CM

## 2020-02-17 DIAGNOSIS — F419 Anxiety disorder, unspecified: Secondary | ICD-10-CM

## 2020-02-17 MED ORDER — CITALOPRAM HYDROBROMIDE 40 MG PO TABS: 40.0000 mg | ORAL_TABLET | Freq: Every day | ORAL | 2 refills | Status: DC

## 2020-03-14 ENCOUNTER — Telehealth: Payer: Self-pay | Admitting: *Deleted

## 2020-03-14 NOTE — Telephone Encounter (Signed)
"

## 2020-03-14 NOTE — Telephone Encounter (Signed)
OZEMPIC PA came in and started Key: [id number] Sent to Plan - again (this was denied 02/17/20)   If denied again - we need to call and cancel RX at the pharm - [facility]

## 2020-03-14 NOTE — Telephone Encounter (Signed)
WENT ON [email or web address] - RESPONSE WAS:  We are unable to find this patient in our records  I CALLED PT - SHE IS NOT NEEDING THE OZEMPIC AND HAS OTHER PLANS FOR WT MGMT.  [facility] called and aware to D/C this med from list

## 2020-03-16 ENCOUNTER — Other Ambulatory Visit: Payer: Self-pay

## 2020-03-16 DIAGNOSIS — F419 Anxiety disorder, unspecified: Secondary | ICD-10-CM

## 2020-03-16 DIAGNOSIS — F339 Major depressive disorder, recurrent, unspecified: Secondary | ICD-10-CM

## 2020-03-16 MED ORDER — CITALOPRAM HYDROBROMIDE 40 MG PO TABS: 40.0000 mg | ORAL_TABLET | Freq: Every day | ORAL | 1 refills | Status: DC

## 2020-03-19 ENCOUNTER — Other Ambulatory Visit: Payer: Self-pay | Admitting: *Deleted

## 2020-03-19 DIAGNOSIS — F419 Anxiety disorder, unspecified: Secondary | ICD-10-CM

## 2020-03-19 DIAGNOSIS — F339 Major depressive disorder, recurrent, unspecified: Secondary | ICD-10-CM

## 2020-03-19 MED ORDER — CITALOPRAM HYDROBROMIDE 40 MG PO TABS: 40.0000 mg | ORAL_TABLET | Freq: Every day | ORAL | 0 refills | Status: DC

## 2020-05-09 ENCOUNTER — Ambulatory Visit: Payer: Commercial Managed Care - PPO | Admitting: Nurse Practitioner

## 2020-05-09 ENCOUNTER — Encounter: Payer: Self-pay | Admitting: Nurse Practitioner

## 2020-05-09 ENCOUNTER — Other Ambulatory Visit: Payer: Self-pay

## 2020-05-09 VITALS — HR 76 | Ht 61.0 in | Wt 245.0 lb

## 2020-05-09 DIAGNOSIS — Z6841 Body Mass Index (BMI) 40.0 and over, adult: Secondary | ICD-10-CM

## 2020-05-09 DIAGNOSIS — E039 Hypothyroidism, unspecified: Secondary | ICD-10-CM

## 2020-05-09 DIAGNOSIS — F419 Anxiety disorder, unspecified: Secondary | ICD-10-CM

## 2020-05-09 DIAGNOSIS — E785 Hyperlipidemia, unspecified: Secondary | ICD-10-CM | POA: Diagnosis not present

## 2020-05-09 NOTE — Assessment & Plan Note (Signed)
No new symptoms of hypothyroidism.  Patient currently on levothyroxine 137 mcg by mouth daily before breakfast.  Lipid panel completed results pending.

## 2020-05-09 NOTE — Patient Instructions (Signed)
Follow-up in 3 months.  High Cholesterol  High cholesterol is a condition in which the blood has high levels of a white, waxy substance similar to fat (cholesterol). The liver makes all the cholesterol that the body needs. The human body needs small amounts of cholesterol to help build cells. A person gets extra or excess cholesterol from the food that he or she eats. The blood carries cholesterol from the liver to the rest of the body. If you have high cholesterol, deposits (plaques) may build up on the walls of your arteries. Arteries are the blood vessels that carry blood away from your heart. These plaques make the arteries narrow and stiff. Cholesterol plaques increase your risk for heart attack and stroke. Work with your health care provider to keep your cholesterol levels in a healthy range. What increases the risk? The following factors may make you more likely to develop this condition:  Eating foods that are high in animal fat (saturated fat) or cholesterol.  Being overweight.  Not getting enough exercise.  A family history of high cholesterol (familial hypercholesterolemia).  Use of tobacco products.  Having diabetes. What are the signs or symptoms? There are no symptoms of this condition. How is this diagnosed? This condition may be diagnosed based on the results of a blood test.  If you are older than 46 years of age, your health care provider may check your cholesterol levels every 4-6 years.  You may be checked more often if you have high cholesterol or other risk factors for heart disease. The blood test for cholesterol measures:  "Bad" cholesterol, or LDL cholesterol. This is the main type of cholesterol that causes heart disease. The desired level is less than 100 mg/dL.  "Good" cholesterol, or HDL cholesterol. HDL helps protect against heart disease by cleaning the arteries and carrying the LDL to the liver for processing. The desired level for HDL is 60 mg/dL or  higher.  Triglycerides. These are fats that your body can store or burn for energy. The desired level is less than 150 mg/dL.  Total cholesterol. This measures the total amount of cholesterol in your blood and includes LDL, HDL, and triglycerides. The desired level is less than 200 mg/dL. How is this treated? This condition may be treated with:  Diet changes. You may be asked to eat foods that have more fiber and less saturated fats or added sugar.  Lifestyle changes. These may include regular exercise, maintaining a healthy weight, and quitting use of tobacco products.  Medicines. These are given when diet and lifestyle changes have not worked. You may be prescribed a statin medicine to help lower your cholesterol levels. Follow these instructions at home: Eating and drinking  Eat a healthy, balanced diet. This diet includes: ? Daily servings of a variety of fresh, frozen, or canned fruits and vegetables. ? Daily servings of whole grain foods that are rich in fiber. ? Foods that are low in saturated fats and trans fats. These include poultry and fish without skin, lean cuts of meat, and low-fat dairy products. ? A variety of fish, especially oily fish that contain omega-3 fatty acids. Aim to eat fish at least 2 times a week.  Avoid foods and drinks that have added sugar.  Use healthy cooking methods, such as roasting, grilling, broiling, baking, poaching, steaming, and stir-frying. Do not fry your food except for stir-frying.   Lifestyle  Get regular exercise. Aim to exercise for a total of 150 minutes a week. Increase your  activity level by doing activities such as gardening, walking, and taking the stairs.  Do not use any products that contain nicotine or tobacco, such as cigarettes, e-cigarettes, and chewing tobacco. If you need help quitting, ask your health care provider.   General instructions  Take over-the-counter and prescription medicines only as told by your health care  provider.  Keep all follow-up visits as told by your health care provider. This is important. Where to find more information  American Heart Association: www.heart.org  National Heart, Lung, and Blood Institute: PopSteam.is Contact a health care provider if:  You have trouble achieving or maintaining a healthy diet or weight.  You are starting an exercise program.  You are unable to stop smoking. Get help right away if:  You have chest pain.  You have trouble breathing.  You have any symptoms of a stroke. "BE FAST" is an easy way to remember the main warning signs of a stroke: ? B - Balance. Signs are dizziness, sudden trouble walking, or loss of balance. ? E - Eyes. Signs are trouble seeing or a sudden change in vision. ? F - Face. Signs are sudden weakness or numbness of the face, or the face or eyelid drooping on one side. ? A - Arms. Signs are weakness or numbness in an arm. This happens suddenly and usually on one side of the body. ? S - Speech. Signs are sudden trouble speaking, slurred speech, or trouble understanding what people say. ? T - Time. Time to call emergency services. Write down what time symptoms started.  You have other signs of a stroke, such as: ? A sudden, severe headache with no known cause. ? Nausea or vomiting. ? Seizure. These symptoms may represent a serious problem that is an emergency. Do not wait to see if the symptoms will go away. Get medical help right away. Call your local emergency services (911 in the [place] Do not drive yourself to the hospital. Summary  Cholesterol plaques increase your risk for heart attack and stroke. Work with your health care provider to keep your cholesterol levels in a healthy range.  Eat a healthy, balanced diet, get regular exercise, and maintain a healthy weight.  Do not use any products that contain nicotine or tobacco, such as cigarettes, e-cigarettes, and chewing tobacco.  Get help right away if you  have any symptoms of a stroke. This information is not intended to replace advice given to you by your health care provider. Make sure you discuss any questions you have with your health care provider. Document Revised: 12/27/2018 Document Reviewed: 12/27/2018 Elsevier Patient Education  2021 [employer]. Hypothyroidism  Hypothyroidism is when the thyroid gland does not make enough of certain hormones (it is underactive). The thyroid gland is a small gland located in the lower front part of the neck, just in front of the windpipe (trachea). This gland makes hormones that help control how the body uses food for energy (metabolism) as well as how the heart and brain function. These hormones also play a role in keeping your bones strong. When the thyroid is underactive, it produces too little of the hormones thyroxine (T4) and triiodothyronine (T3). What are the causes? This condition may be caused by:  Hashimoto's disease. This is a disease in which the body's disease-fighting system (immune system) attacks the thyroid gland. This is the most common cause.  Viral infections.  Pregnancy.  Certain medicines.  Birth defects.  Past radiation treatments to the head or neck for  cancer.  Past treatment with radioactive iodine.  Past exposure to radiation in the environment.  Past surgical removal of part or all of the thyroid.  Problems with a gland in the center of the brain (pituitary gland).  Lack of enough iodine in the diet. What increases the risk? You are more likely to develop this condition if:  You are female.  You have a family history of thyroid conditions.  You use a medicine called lithium.  You take medicines that affect the immune system (immunosuppressants). What are the signs or symptoms? Symptoms of this condition include:  Feeling as though you have no energy (lethargy).  Not being able to tolerate cold.  Weight gain that is not explained by a change in  diet or exercise habits.  Lack of appetite.  Dry skin.  Coarse hair.  Menstrual irregularity.  Slowing of thought processes.  Constipation.  Sadness or depression. How is this diagnosed? This condition may be diagnosed based on:  Your symptoms, your medical history, and a physical exam.  Blood tests. You may also have imaging tests, such as an ultrasound or MRI. How is this treated? This condition is treated with medicine that replaces the thyroid hormones that your body does not make. After you begin treatment, it may take several weeks for symptoms to go away. Follow these instructions at home:  Take over-the-counter and prescription medicines only as told by your health care provider.  If you start taking any new medicines, tell your health care provider.  Keep all follow-up visits as told by your health care provider. This is important. ? As your condition improves, your dosage of thyroid hormone medicine may change. ? You will need to have blood tests regularly so that your health care provider can monitor your condition. Contact a health care provider if:  Your symptoms do not get better with treatment.  You are taking thyroid hormone replacement medicine and you: ? Sweat a lot. ? Have tremors. ? Feel anxious. ? Lose weight rapidly. ? Cannot tolerate heat. ? Have emotional swings. ? Have diarrhea. ? Feel weak. Get help right away if you have:  Chest pain.  An irregular heartbeat.  A rapid heartbeat.  Difficulty breathing. Summary  Hypothyroidism is when the thyroid gland does not make enough of certain hormones (it is underactive).  When the thyroid is underactive, it produces too little of the hormones thyroxine (T4) and triiodothyronine (T3).  The most common cause is Hashimoto's disease, a disease in which the body's disease-fighting system (immune system) attacks the thyroid gland. The condition can also be caused by viral infections, medicine,  pregnancy, or past radiation treatment to the head or neck.  Symptoms may include weight gain, dry skin, constipation, feeling as though you do not have energy, and not being able to tolerate cold.  This condition is treated with medicine to replace the thyroid hormones that your body does not make. This information is not intended to replace advice given to you by your health care provider. Make sure you discuss any questions you have with your health care provider. Document Revised: 10/28/2019 Document Reviewed: 10/13/2019 Elsevier Patient Education  2021 [employer].

## 2020-05-09 NOTE — Assessment & Plan Note (Signed)
Anxiety well controlled on Celexa 40 mg tablet by mouth daily

## 2020-05-09 NOTE — Progress Notes (Signed)
Established Patient Office Visit  Subjective:  Patient ID: Angela Newman, female    DOB: [birth year]  Age: 45 y.o. MRN: [id number]  CC:  Chief Complaint  Patient presents with  . Medical Management of Chronic Issues    HPI Angela Newman presents for Thyroid: Patient presents for evaluation of hypothyroidism. Current symptoms include anxiousness. Patient denies anxiousness.   Mixed hyperlipidemia  patient presents with hyperlipidemia. Patient was diagnosed about a year ago. Compliance with treatment has been good; The patient is compliant with medications, maintains a low cholesterol diet , follows up as directed , and maintains an exercise regimen . The patient denies experiencing any hypercholesterolemia related symptoms.   Anxiety: Patient complains of anxiety disorder.  She has the following symptoms: fatigue. Onset of symptoms was approximately a few months ago, gradually improving since that time. She denies current suicidal and homicidal ideation. Family history significant for no psychiatric illness.Possible organic causes contributing are: none. Risk factors: previous episode of depression Previous treatment includes Celexa.  She complains of the following side effects from the treatment: none.    Past Medical History:  Diagnosis Date  . Anxiety   . Depression   . Fatigue   . Graves disease   . Hyperlipidemia   . Hypertension   . Hypothyroid   . Snoring     History reviewed. No pertinent surgical history.  Family History  Problem Relation Age of Onset  . Headache Mother   . Hypertension Mother   . Stroke Mother   . Sleep apnea Mother   . Cancer Mother        liver  . Heart attack Father   . Heart attack Sister   . Hypertension Sister   . Restless legs syndrome Sister   . Cancer Sister   . Lymphoma Sister   . Hypothyroidism Daughter     Social History   Socioeconomic History  . Marital status: Married    Spouse name: Not on file  . Number of children:  Not on file  . Years of education: Not on file  . Highest education level: Not on file  Occupational History  . Not on file  Tobacco Use  . Smoking status: Former Smoker    Packs/day: 1.00    Types: Cigarettes    Quit date: 09/07/2013    Years since quitting: 6.6  . Smokeless tobacco: Never Used  Vaping Use  . Vaping Use: Never used  Substance and Sexual Activity  . Alcohol use: No    Alcohol/week: 0.0 standard drinks  . Drug use: No  . Sexual activity: Yes  Other Topics Concern  . Not on file  Social History Narrative   Drinks 2 cups of caffeine daily.   Social Determinants of Health   Financial Resource Strain: Not on file  Food Insecurity: Not on file  Transportation Needs: Not on file  Physical Activity: Not on file  Stress: Not on file  Social Connections: Not on file  Intimate Partner Violence: Not on file    Outpatient Medications Prior to Visit  Medication Sig Dispense Refill  . atorvastatin (LIPITOR) 20 MG tablet Take 1 tablet (20 mg total) by mouth daily. 30 tablet 1  . citalopram (CELEXA) 40 MG tablet Take 1 tablet (40 mg total) by mouth daily. 90 tablet 0  . ibuprofen (ADVIL) 800 MG tablet Take 1 tablet (800 mg total) by mouth every 8 (eight) hours as needed. 90 tablet 5  . levothyroxine (SYNTHROID, LEVOTHROID) 137 MCG tablet  Take 137 mcg by mouth daily before breakfast.    . Melatonin 5 MG CAPS Take 1 capsule (5 mg total) by mouth at bedtime as needed (insomnia). 60 capsule 1  . polyethylene glycol powder (GLYCOLAX/MIRALAX) 17 GM/SCOOP powder Take 17 g by mouth 2 (two) times daily as needed. 850 g 11  . ALPRAZolam (XANAX) 0.25 MG tablet Take 1 tablet (0.25 mg total) by mouth at bedtime as needed for anxiety. (Patient not taking: No sig reported) 60 tablet 2  . Iron, Ferrous Sulfate, 325 (65 Fe) MG TABS Take 1 tablet by mouth daily. With food/orange juice 90 tablet 0  . Semaglutide,0.25 or 0.5MG/DOS, (OZEMPIC, 0.25 OR 0.5 MG/DOSE,) 2 MG/1.5ML SOPN Inject 0.5 mg  into the skin once a week. 1 each 2   No facility-administered medications prior to visit.    No Known Allergies  ROS Review of Systems  Constitutional: Negative.   HENT: Negative.   Eyes: Negative.   Respiratory: Negative.   Cardiovascular: Negative.   Gastrointestinal: Negative.   Genitourinary: Negative.   Musculoskeletal: Negative.   Skin: Negative.   All other systems reviewed and are negative.     Objective:    Physical Exam Vitals reviewed.  Constitutional:      Appearance: Normal appearance.  HENT:     Head: Normocephalic.     Nose: Nose normal.     Mouth/Throat:     Mouth: Mucous membranes are moist.     Pharynx: Oropharynx is clear.  Eyes:     General:        Right eye: No discharge.        Left eye: No discharge.     Conjunctiva/sclera: Conjunctivae normal.  Cardiovascular:     Rate and Rhythm: Normal rate and regular rhythm.     Pulses: Normal pulses.     Heart sounds: Normal heart sounds.  Pulmonary:     Effort: Pulmonary effort is normal.     Breath sounds: Normal breath sounds.  Abdominal:     General: Bowel sounds are normal.  Musculoskeletal:        General: Normal range of motion.     Cervical back: Normal range of motion.  Skin:    General: Skin is warm.  Neurological:     Mental Status: She is alert and oriented to person, place, and time.  Psychiatric:        Mood and Affect: Mood normal.        Behavior: Behavior normal.     Pulse 76   Ht '5\' 1"'  (1.549 m)   Wt 245 lb (111.1 kg)   SpO2 98%   BMI 46.29 kg/m  Wt Readings from Last 3 Encounters:  05/09/20 245 lb (111.1 kg)  02/09/20 240 lb 6.4 oz (109 kg)  11/01/19 241 lb (109.3 kg)     Health Maintenance Due  Topic Date Due  . Hepatitis C Screening  Never done  . COVID-19 Vaccine (1) Never done  . HIV Screening  Never done  . PAP SMEAR-Modifier  Never done  . COLONOSCOPY (Pts 45-60yr Insurance coverage will need to be confirmed)  Never done  . MAMMOGRAM  04/20/2020     There are no preventive care reminders to display for this patient.  No results found for: TSH Lab Results  Component Value Date   WBC 8.3 11/01/2019   HGB 9.2 (L) 11/01/2019   HCT 32.1 (L) 11/01/2019   MCV 68 (L) 11/01/2019   PLT 333 11/01/2019  No results found for: NA, K, CHLORIDE, CO2, GLUCOSE, BUN, CREATININE, BILITOT, ALKPHOS, AST, ALT, PROT, ALBUMIN, CALCIUM, ANIONGAP, EGFR, GFR Lab Results  Component Value Date   CHOL 144 11/01/2019   Lab Results  Component Value Date   HDL 36 (L) 11/01/2019   Lab Results  Component Value Date   LDLCALC 95 11/01/2019   Lab Results  Component Value Date   TRIG 63 11/01/2019   Lab Results  Component Value Date   CHOLHDL 4.0 11/01/2019   No results found for: HGBA1C    Assessment & Plan:   Problem List Items Addressed This Visit      Endocrine   Hypothyroid    No new symptoms of hypothyroidism.  Patient currently on levothyroxine 137 mcg by mouth daily before breakfast.  Lipid panel completed results pending.      Relevant Orders   TSH     Other   Hyperlipidemia - Primary   Relevant Orders   Lipid Panel   Comprehensive metabolic panel   CBC with Differential   Anxiety    Anxiety well controlled on Celexa 40 mg tablet by mouth daily      BMI 40.0-44.9, adult (HCC)      No orders of the defined types were placed in this encounter.   Follow-up: Return in about 3 months (around 08/09/2020).    Ivy Lynn, NP

## 2020-05-10 LAB — CBC WITH DIFFERENTIAL/PLATELET
Basophils Absolute: 0.1 10*3/uL (ref 0.0–0.2)
Basos: 1 %
EOS (ABSOLUTE): 0.2 10*3/uL (ref 0.0–0.4)
Eos: 2 %
Hematocrit: 37.4 % (ref 34.0–46.6)
Hemoglobin: 12.6 g/dL (ref 11.1–15.9)
Immature Grans (Abs): 0 10*3/uL (ref 0.0–0.1)
Immature Granulocytes: 0 %
Lymphocytes Absolute: 1.7 10*3/uL (ref 0.7–3.1)
Lymphs: 17 %
MCH: 28.8 pg (ref 26.6–33.0)
MCHC: 33.7 g/dL (ref 31.5–35.7)
MCV: 85 fL (ref 79–97)
Monocytes Absolute: 0.5 10*3/uL (ref 0.1–0.9)
Monocytes: 5 %
Neutrophils Absolute: 7.3 10*3/uL — ABNORMAL HIGH (ref 1.4–7.0)
Neutrophils: 75 %
Platelets: 271 10*3/uL (ref 150–450)
RBC: 4.38 x10E6/uL (ref 3.77–5.28)
RDW: 13.7 % (ref 11.7–15.4)
WBC: 9.7 10*3/uL (ref 3.4–10.8)

## 2020-05-10 LAB — LIPID PANEL
Chol/HDL Ratio: 3.6 ratio (ref 0.0–4.4)
Cholesterol, Total: 149 mg/dL (ref 100–199)
HDL: 41 mg/dL (ref >39)
LDL Chol Calc (NIH): 93 mg/dL (ref 0–99)
Triglycerides: 77 mg/dL (ref 0–149)
VLDL Cholesterol Cal: 15 mg/dL (ref 5–40)

## 2020-05-10 LAB — COMPREHENSIVE METABOLIC PANEL
ALT: 19 IU/L (ref 0–32)
AST: 18 IU/L (ref 0–40)
Albumin/Globulin Ratio: 1.4 (ref 1.2–2.2)
Albumin: 4 g/dL (ref 3.8–4.8)
Alkaline Phosphatase: 158 IU/L — ABNORMAL HIGH (ref 44–121)
BUN/Creatinine Ratio: 13 (ref 9–23)
BUN: 9 mg/dL (ref 6–24)
Bilirubin Total: 0.4 mg/dL (ref 0.0–1.2)
CO2: 18 mmol/L — ABNORMAL LOW (ref 20–29)
Calcium: 8.7 mg/dL (ref 8.7–10.2)
Chloride: 102 mmol/L (ref 96–106)
Creatinine, Ser: 0.67 mg/dL (ref 0.57–1.00)
Globulin, Total: 2.8 g/dL (ref 1.5–4.5)
Glucose: 84 mg/dL (ref 65–99)
Potassium: 4.2 mmol/L (ref 3.5–5.2)
Sodium: 139 mmol/L (ref 134–144)
Total Protein: 6.8 g/dL (ref 6.0–8.5)
eGFR: 109 mL/min/{1.73_m2} (ref >59)

## 2020-05-10 LAB — TSH: TSH: 0.408 u[IU]/mL — ABNORMAL LOW (ref 0.450–4.500)

## 2020-05-12 ENCOUNTER — Other Ambulatory Visit: Payer: Self-pay | Admitting: Nurse Practitioner

## 2020-05-12 DIAGNOSIS — E039 Hypothyroidism, unspecified: Secondary | ICD-10-CM

## 2020-05-12 MED ORDER — LEVOTHYROXINE SODIUM 125 MCG PO TABS
125.0000 ug | ORAL_TABLET | Freq: Every day | ORAL | 0 refills | Status: DC
Start: 2020-05-12 — End: 2020-08-20

## 2020-05-14 ENCOUNTER — Other Ambulatory Visit: Payer: Self-pay | Admitting: *Deleted

## 2020-05-14 DIAGNOSIS — E039 Hypothyroidism, unspecified: Secondary | ICD-10-CM

## 2020-06-01 LAB — HM MAMMOGRAPHY: HM Mammogram: NORMAL (ref 0–4)

## 2020-07-24 ENCOUNTER — Other Ambulatory Visit: Payer: Self-pay | Admitting: *Deleted

## 2020-07-24 DIAGNOSIS — F339 Major depressive disorder, recurrent, unspecified: Secondary | ICD-10-CM

## 2020-07-24 DIAGNOSIS — F419 Anxiety disorder, unspecified: Secondary | ICD-10-CM

## 2020-07-24 MED ORDER — CITALOPRAM HYDROBROMIDE 40 MG PO TABS: 40.0000 mg | ORAL_TABLET | Freq: Every day | ORAL | 0 refills | Status: DC

## 2020-07-27 ENCOUNTER — Other Ambulatory Visit: Payer: Self-pay | Admitting: *Deleted

## 2020-07-27 NOTE — Progress Notes (Signed)
[facility] 65mg  tab  Is being requested by [facility] as a refill   Typically OTC - may can get cheaper by RX, BUT was not on our profile.   Please address if pt needs this.

## 2020-07-30 NOTE — Progress Notes (Signed)
Hgb is good at 12.6, pt has appt on 08/10/20, we will see if pt needs iron at this visit. D/C iron until further notice. Pt and pharmacy aware

## 2020-08-10 ENCOUNTER — Ambulatory Visit: Payer: Commercial Managed Care - PPO | Admitting: Nurse Practitioner

## 2020-08-10 ENCOUNTER — Encounter: Payer: Self-pay | Admitting: Nurse Practitioner

## 2020-08-10 ENCOUNTER — Other Ambulatory Visit: Payer: Self-pay

## 2020-08-10 VITALS — BP 124/87 | HR 81 | Temp 97.3°F | Ht 61.0 in | Wt 245.0 lb

## 2020-08-10 DIAGNOSIS — F419 Anxiety disorder, unspecified: Secondary | ICD-10-CM

## 2020-08-10 DIAGNOSIS — Z23 Encounter for immunization: Secondary | ICD-10-CM

## 2020-08-10 DIAGNOSIS — I10 Essential (primary) hypertension: Secondary | ICD-10-CM | POA: Diagnosis not present

## 2020-08-10 DIAGNOSIS — E039 Hypothyroidism, unspecified: Secondary | ICD-10-CM | POA: Diagnosis not present

## 2020-08-10 MED ORDER — ATORVASTATIN CALCIUM 20 MG PO TABS: 20.0000 mg | ORAL_TABLET | Freq: Every day | ORAL | 2 refills | Status: DC

## 2020-08-10 NOTE — Assessment & Plan Note (Signed)
Hypertension well controlled. Continue low-sodium diet, exercise and weight loss.

## 2020-08-10 NOTE — Progress Notes (Signed)
Established Patient Office Visit  Subjective:  Patient ID: Angela Newman, female    DOB: 01/17/1976  Age: 45 y.o. MRN: [id number]  CC:  Chief Complaint  Patient presents with   Medical Management of Chronic Issues    HPI [name] presents for follow up of hypertension. Patient was diagnosed in few months ago. Compliance with treatment has been good; including taking medication as directed , maintains a healthy diet and regular exercise regimen , and following up as directed.   Mixed hyperlipidemia  Pt presents with hyperlipidemia. Patient was diagnosed in  05/09/2020 Compliance with treatment has been good; The patient is compliant with medications, maintains a low cholesterol diet , follows up as directed , and maintains an exercise regimen . The patient denies experiencing any hypercholesterolemia related symptoms.  Current medication Lipitor 20 mg tablet by mouth daily.  Thyroid: Patient presents for evaluation of hypothyroidism and Hypothyroidism. Current symptoms include fatigue, weight gain. Patient denies denies heat/cold intolerance, bowel/skin changes or CVS symptoms.     [facility] Office Visit from 08/10/2020 in [facility]  PHQ-9 Total Score 10       GAD 7 : Generalized Anxiety Score 08/10/2020 05/09/2020 02/09/2020 04/26/2019  Nervous, Anxious, on Edge 1 0 1 2  Control/stop worrying 1 0 0 2  Worry too much - different things 1 0 0 1  Trouble relaxing 0 0 0 1  Restless 0 0 0 0  Easily annoyed or irritable 2 0 1 3  Afraid - awful might happen 2 0 2 2  Total GAD 7 Score 7 0 4 11  Anxiety Difficulty Not difficult at all - Not difficult at all Somewhat difficult       Past Medical History:  Diagnosis Date   Anxiety    Depression    Fatigue    Graves disease    Hyperlipidemia    Hypertension    Hypothyroid    Snoring     History reviewed. No pertinent surgical history.  Family History  Problem Relation Age of Onset   Headache  Mother    Hypertension Mother    Stroke Mother    Sleep apnea Mother    Cancer Mother        liver   Heart attack Father    Heart attack Sister    Hypertension Sister    Restless legs syndrome Sister    Cancer Sister    Lymphoma Sister    Hypothyroidism Daughter     Social History   Socioeconomic History   Marital status: Married    Spouse name: Not on file   Number of children: Not on file   Years of education: Not on file   Highest education level: Not on file  Occupational History   Not on file  Tobacco Use   Smoking status: Former    Packs/day: 1.00    Pack years: 0.00    Types: Cigarettes    Quit date: 09/07/2013    Years since quitting: 6.9   Smokeless tobacco: Never  Vaping Use   Vaping Use: Never used  Substance and Sexual Activity   Alcohol use: No    Alcohol/week: 0.0 standard drinks   Drug use: No   Sexual activity: Yes  Other Topics Concern   Not on file  Social History Narrative   Drinks 2 cups of caffeine daily.   Social Determinants of Health   Financial Resource Strain: Not on file  Food Insecurity: Not on file  Transportation Needs: Not on file  Physical Activity: Not on file  Stress: Not on file  Social Connections: Not on file  Intimate Partner Violence: Not on file    Outpatient Medications Prior to Visit  Medication Sig Dispense Refill   citalopram (CELEXA) 40 MG tablet Take 1 tablet (40 mg total) by mouth daily. 90 tablet 0   etonogestrel (NEXPLANON) 68 MG IMPL implant 1 each by Subdermal route once.     ibuprofen (ADVIL) 800 MG tablet Take 1 tablet (800 mg total) by mouth every 8 (eight) hours as needed. 90 tablet 5   levothyroxine (SYNTHROID) 125 MCG tablet Take 1 tablet (125 mcg total) by mouth daily. 90 tablet 0   Melatonin 5 MG CAPS Take 1 capsule (5 mg total) by mouth at bedtime as needed (insomnia). 60 capsule 1   polyethylene glycol powder (GLYCOLAX/MIRALAX) 17 GM/SCOOP powder Take 17 g by mouth 2 (two) times daily as needed.  850 g 11   atorvastatin (LIPITOR) 20 MG tablet Take 1 tablet (20 mg total) by mouth daily. 30 tablet 1   No facility-administered medications prior to visit.    Not on File  ROS Review of Systems  Constitutional: Negative.   HENT: Negative.    Eyes: Negative.   Respiratory: Negative.    Cardiovascular: Negative.   Genitourinary: Negative.   Musculoskeletal: Negative.   Skin:  Negative for rash.  Neurological: Negative.   All other systems reviewed and are negative.    Objective:    Physical Exam Vitals and nursing note reviewed.  Constitutional:      General: She is awake.     Appearance: She is overweight.  HENT:     Nose: Nose normal.     Mouth/Throat:     Mouth: Mucous membranes are moist.     Pharynx: Oropharynx is clear.  Eyes:     Conjunctiva/sclera: Conjunctivae normal.  Cardiovascular:     Rate and Rhythm: Normal rate and regular rhythm.     Pulses: Normal pulses.  Pulmonary:     Effort: Pulmonary effort is normal.     Breath sounds: Normal breath sounds.  Abdominal:     General: Bowel sounds are normal.  Skin:    General: Skin is warm.  Neurological:     Mental Status: She is alert and oriented to person, place, and time.  Psychiatric:        Attention and Perception: Attention and perception normal.        Mood and Affect: Mood is anxious and depressed.        Behavior: Behavior normal. Behavior is cooperative.        Thought Content: Thought content normal.        Cognition and Memory: Cognition normal.        Judgment: Judgment normal.    BP 124/87   Pulse 81   Temp (!) 97.3 F (36.3 C) (Temporal)   Ht '5\' 1"'  (1.549 m)   Wt 245 lb (111.1 kg)   SpO2 97%   BMI 46.29 kg/m  Wt Readings from Last 3 Encounters:  08/10/20 245 lb (111.1 kg)  05/09/20 245 lb (111.1 kg)  02/09/20 240 lb 6.4 oz (109 kg)     Health Maintenance Due  Topic Date Due   COVID-19 Vaccine (1) Never done   HIV Screening  Never done   Hepatitis C Screening  Never  done   TETANUS/TDAP  Never done   PAP SMEAR-Modifier  Never done   COLONOSCOPY (  Pts 45-62yr Insurance coverage will need to be confirmed)  Never done   MAMMOGRAM  04/20/2020    There are no preventive care reminders to display for this patient.  Lab Results  Component Value Date   TSH 0.408 (L) 05/09/2020   Lab Results  Component Value Date   WBC 9.7 05/09/2020   HGB 12.6 05/09/2020   HCT 37.4 05/09/2020   MCV 85 05/09/2020   PLT 271 05/09/2020   Lab Results  Component Value Date   NA 139 05/09/2020   K 4.2 05/09/2020   CO2 18 (L) 05/09/2020   GLUCOSE 84 05/09/2020   BUN 9 05/09/2020   CREATININE 0.67 05/09/2020   BILITOT 0.4 05/09/2020   ALKPHOS 158 (H) 05/09/2020   AST 18 05/09/2020   ALT 19 05/09/2020   PROT 6.8 05/09/2020   ALBUMIN 4.0 05/09/2020   CALCIUM 8.7 05/09/2020   EGFR 109 05/09/2020   Lab Results  Component Value Date   CHOL 149 05/09/2020   Lab Results  Component Value Date   HDL 41 05/09/2020   Lab Results  Component Value Date   LDLCALC 93 05/09/2020   Lab Results  Component Value Date   TRIG 77 05/09/2020   Lab Results  Component Value Date   CHOLHDL 3.6 05/09/2020   No results found for: HGBA1C    Assessment & Plan:   Problem List Items Addressed This Visit       Cardiovascular and Mediastinum   Hypertension - Primary    Hypertension well controlled. Continue low-sodium diet, exercise and weight loss.         Relevant Medications   atorvastatin (LIPITOR) 20 MG tablet   Other Relevant Orders   CBC with Differential   Comprehensive metabolic panel   TSH     Endocrine   Hypothyroid    No signs and symptoms of hypothyroidism.  Completed labs-TSH.  Follow-up in 3 months.       Relevant Orders   Lipid Panel     Other   Anxiety    Anxiety well controlled on Celexa 40 mg.  Completed GAD-7       Other Visit Diagnoses     Essential hypertension       Relevant Medications   atorvastatin (LIPITOR) 20 MG  tablet       Meds ordered this encounter  Medications   atorvastatin (LIPITOR) 20 MG tablet    Sig: Take 1 tablet (20 mg total) by mouth daily.    Dispense:  90 tablet    Refill:  2    Order Specific Question:   Supervising Provider    Answer:   GJanora Norlander[[[id number]   Follow-up: Return in about 3 months (around 11/10/2020).    OIvy Lynn NP

## 2020-08-10 NOTE — Addendum Note (Signed)
Addended byDory Peru on: 08/10/2020 04:08 PM   Modules accepted: Orders

## 2020-08-10 NOTE — Assessment & Plan Note (Signed)
No signs and symptoms of hypothyroidism.  Completed labs-TSH.  Follow-up in 3 months.

## 2020-08-10 NOTE — Patient Instructions (Signed)
Hypothyroidism  Hypothyroidism is when the thyroid gland does not make enough of certain hormones (it is underactive). The thyroid gland is a small gland located in the lower front part of the neck, just in front of the windpipe (trachea). This gland makes hormones that help control how the body uses food for energy (metabolism) as well as how the heart and brain function. These hormones also play a role in keeping your bones strong. When the thyroid is underactive, it produces too little of the hormones thyroxine (T4) and triiodothyronine (T3). What are the causes? This condition may be caused by: Hashimoto's disease. This is a disease in which the body's disease-fighting system (immune system) attacks the thyroid gland. This is the most common cause. Viral infections. Pregnancy. Certain medicines. Birth defects. Past radiation treatments to the head or neck for cancer. Past treatment with radioactive iodine. Past exposure to radiation in the environment. Past surgical removal of part or all of the thyroid. Problems with a gland in the center of the brain (pituitary gland). Lack of enough iodine in the diet. What increases the risk? You are more likely to develop this condition if: You are female. You have a family history of thyroid conditions. You use a medicine called lithium. You take medicines that affect the immune system (immunosuppressants). What are the signs or symptoms? Symptoms of this condition include: Feeling as though you have no energy (lethargy). Not being able to tolerate cold. Weight gain that is not explained by a change in diet or exercise habits. Lack of appetite. Dry skin. Coarse hair. Menstrual irregularity. Slowing of thought processes. Constipation. Sadness or depression. How is this diagnosed? This condition may be diagnosed based on: Your symptoms, your medical history, and a physical exam. Blood tests. You may also have imaging tests, such as an  ultrasound or MRI. How is this treated? This condition is treated with medicine that replaces the thyroid hormones that your body does not make. After you begin treatment, it may take several weeksfor symptoms to go away. Follow these instructions at home: Take over-the-counter and prescription medicines only as told by your health care provider. If you start taking any new medicines, tell your health care provider. Keep all follow-up visits as told by your health care provider. This is important. As your condition improves, your dosage of thyroid hormone medicine may change. You will need to have blood tests regularly so that your health care provider can monitor your condition. Contact a health care provider if: Your symptoms do not get better with treatment. You are taking thyroid hormone replacement medicine and you: Sweat a lot. Have tremors. Feel anxious. Lose weight rapidly. Cannot tolerate heat. Have emotional swings. Have diarrhea. Feel weak. Get help right away if you have: Chest pain. An irregular heartbeat. A rapid heartbeat. Difficulty breathing. Summary Hypothyroidism is when the thyroid gland does not make enough of certain hormones (it is underactive). When the thyroid is underactive, it produces too little of the hormones thyroxine (T4) and triiodothyronine (T3). The most common cause is Hashimoto's disease, a disease in which the body's disease-fighting system (immune system) attacks the thyroid gland. The condition can also be caused by viral infections, medicine, pregnancy, or past radiation treatment to the head or neck. Symptoms may include weight gain, dry skin, constipation, feeling as though you do not have energy, and not being able to tolerate cold. This condition is treated with medicine to replace the thyroid hormones that your body does not make. This information   is not intended to replace advice given to you by your health care provider. Make sure you  discuss any questions you have with your healthcare provider. Document Revised: 10/28/2019 Document Reviewed: 10/13/2019 Elsevier Patient Education  2022 Elsevier Inc. Hypertension, Adult High blood pressure (hypertension) is when the force of blood pumping through the arteries is too strong. The arteries are the blood vessels that carry blood from the heart throughout the body. Hypertension forces the heart to work harder to pump blood and may cause arteries to become narrow or stiff. Untreated or uncontrolled hypertension can cause a heart attack, heart failure, a stroke, kidney disease, and otherproblems. A blood pressure reading consists of a higher number over a lower number. Ideally, your blood pressure should be below 120/80. The first ("top") number is called the systolic pressure. It is a measure of the pressure in your arteries as your heart beats. The second ("bottom") number is called the diastolic pressure. It is a measure of the pressure in your arteries as theheart relaxes. What are the causes? The exact cause of this condition is not known. There are some conditions thatresult in or are related to high blood pressure. What increases the risk? Some risk factors for high blood pressure are under your control. The following factors may make you more likely to develop this condition: Smoking. Having type 2 diabetes mellitus, high cholesterol, or both. Not getting enough exercise or physical activity. Being overweight. Having too much fat, sugar, calories, or salt (sodium) in your diet. Drinking too much alcohol. Some risk factors for high blood pressure may be difficult or impossible to change. Some of these factors include: Having chronic kidney disease. Having a family history of high blood pressure. Age. Risk increases with age. Race. You may be at higher risk if you are African American. Gender. Men are at higher risk than women before age 35. After age 72, women are at higher  risk than men. Having obstructive sleep apnea. Stress. What are the signs or symptoms? High blood pressure may not cause symptoms. Very high blood pressure (hypertensive crisis) may cause: Headache. Anxiety. Shortness of breath. Nosebleed. Nausea and vomiting. Vision changes. Severe chest pain. Seizures. How is this diagnosed? This condition is diagnosed by measuring your blood pressure while you are seated, with your arm resting on a flat surface, your legs uncrossed, and your feet flat on the floor. The cuff of the blood pressure monitor will be placed directly against the skin of your upper arm at the level of your heart. It should be measured at least twice using the same arm. Certain conditions cancause a difference in blood pressure between your right and left arms. Certain factors can cause blood pressure readings to be lower or higher than normal for a short period of time: When your blood pressure is higher when you are in a health care provider's office than when you are at home, this is called white coat hypertension. Most people with this condition do not need medicines. When your blood pressure is higher at home than when you are in a health care provider's office, this is called masked hypertension. Most people with this condition may need medicines to control blood pressure. If you have a high blood pressure reading during one visit or you have normal blood pressure with other risk factors, you may be asked to: Return on a different day to have your blood pressure checked again. Monitor your blood pressure at home for 1 week or longer. If  you are diagnosed with hypertension, you may have other blood or imaging tests to help your health care provider understand your overall risk for otherconditions. How is this treated? This condition is treated by making healthy lifestyle changes, such as eating healthy foods, exercising more, and reducing your alcohol intake. Your health care  provider may prescribe medicine if lifestyle changes are not enough to get your blood pressure under control, and if: Your systolic blood pressure is above 130. Your diastolic blood pressure is above 80. Your personal target blood pressure may vary depending on your medicalconditions, your age, and other factors. Follow these instructions at home: Eating and drinking  Eat a diet that is high in fiber and potassium, and low in sodium, added sugar, and fat. An example eating plan is called the DASH (Dietary Approaches to Stop Hypertension) diet. To eat this way: Eat plenty of fresh fruits and vegetables. Try to fill one half of your plate at each meal with fruits and vegetables. Eat whole grains, such as whole-wheat pasta, brown rice, or whole-grain bread. Fill about one fourth of your plate with whole grains. Eat or drink low-fat dairy products, such as skim milk or low-fat yogurt. Avoid fatty cuts of meat, processed or cured meats, and poultry with skin. Fill about one fourth of your plate with lean proteins, such as fish, chicken without skin, beans, eggs, or tofu. Avoid pre-made and processed foods. These tend to be higher in sodium, added sugar, and fat. Reduce your daily sodium intake. Most people with hypertension should eat less than 1,500 mg of sodium a day. Do not drink alcohol if: Your health care provider tells you not to drink. You are pregnant, may be pregnant, or are planning to become pregnant. If you drink alcohol: Limit how much you use to: 0-1 drink a day for women. 0-2 drinks a day for men. Be aware of how much alcohol is in your drink. In the [place] one drink equals one 12 oz bottle of beer (355 mL), one 5 oz glass of wine (148 mL), or one 1 oz glass of hard liquor (44 mL).  Lifestyle  Work with your health care provider to maintain a healthy body weight or to lose weight. Ask what an ideal weight is for you. Get at least 30 minutes of exercise most days of the week.  Activities may include walking, swimming, or biking. Include exercise to strengthen your muscles (resistance exercise), such as Pilates or lifting weights, as part of your weekly exercise routine. Try to do these types of exercises for 30 minutes at least 3 days a week. Do not use any products that contain nicotine or tobacco, such as cigarettes, e-cigarettes, and chewing tobacco. If you need help quitting, ask your health care provider. Monitor your blood pressure at home as told by your health care provider. Keep all follow-up visits as told by your health care provider. This is important.  Medicines Take over-the-counter and prescription medicines only as told by your health care provider. Follow directions carefully. Blood pressure medicines must be taken as prescribed. Do not skip doses of blood pressure medicine. Doing this puts you at risk for problems and can make the medicine less effective. Ask your health care provider about side effects or reactions to medicines that you should watch for. Contact a health care provider if you: Think you are having a reaction to a medicine you are taking. Have headaches that keep coming back (recurring). Feel dizzy. Have swelling in your ankles.  Have trouble with your vision. Get help right away if you: Develop a severe headache or confusion. Have unusual weakness or numbness. Feel faint. Have severe pain in your chest or abdomen. Vomit repeatedly. Have trouble breathing. Summary Hypertension is when the force of blood pumping through your arteries is too strong. If this condition is not controlled, it may put you at risk for serious complications. Your personal target blood pressure may vary depending on your medical conditions, your age, and other factors. For most people, a normal blood pressure is less than 120/80. Hypertension is treated with lifestyle changes, medicines, or a combination of both. Lifestyle changes include losing weight,  eating a healthy, low-sodium diet, exercising more, and limiting alcohol. This information is not intended to replace advice given to you by your health care provider. Make sure you discuss any questions you have with your healthcare provider. Document Revised: 10/07/2017 Document Reviewed: 10/07/2017 Elsevier Patient Education  2022 Elsevier Inc. http://NIMH.NIH.Gov">  Generalized Anxiety Disorder, Adult Generalized anxiety disorder (GAD) is a mental health condition. Unlike normal worries, anxiety related to GAD is not triggered by a specific event. These worries do not fade or get better with time. GAD interferes with relationships,work, and school. GAD symptoms can vary from mild to severe. People with severe GAD can have intense waves of anxiety with physical symptoms that are similar to panicattacks. What are the causes? The exact cause of GAD is not known, but the following are believed to have an impact: Differences in natural brain chemicals. Genes passed down from parents to children. Differences in the way threats are perceived. Development during childhood. Personality. What increases the risk? The following factors may make you more likely to develop this condition: Being female. Having a family history of anxiety disorders. Being very shy. Experiencing very stressful life events, such as the death of a loved one. Having a very stressful family environment. What are the signs or symptoms? People with GAD often worry excessively about many things in their lives, such as their health and family. Symptoms may also include: Mental and emotional symptoms: Worrying excessively about natural disasters. Fear of being late. Difficulty concentrating. Fears that others are judging your performance. Physical symptoms: Fatigue. Headaches, muscle tension, muscle twitches, trembling, or feeling shaky. Feeling like your heart is pounding or beating very fast. Feeling out of breath or  like you cannot take a deep breath. Having trouble falling asleep or staying asleep, or experiencing restlessness. Sweating. Nausea, diarrhea, or irritable bowel syndrome (IBS). Behavioral symptoms: Experiencing erratic moods or irritability. Avoidance of new situations. Avoidance of people. Extreme difficulty making decisions. How is this diagnosed? This condition is diagnosed based on your symptoms and medical history. You will also have a physical exam. Your health care provider may perform tests torule out other possible causes of your symptoms. To be diagnosed with GAD, a person must have anxiety that: Is out of his or her control. Affects several different aspects of his or her life, such as work and relationships. Causes distress that makes him or her unable to take part in normal activities. Includes at least three symptoms of GAD, such as restlessness, fatigue, trouble concentrating, irritability, muscle tension, or sleep problems. Before your health care provider can confirm a diagnosis of GAD, these symptoms must be present more days than they are not, and they must last for 6 months orlonger. How is this treated? This condition may be treated with: Medicine. Antidepressant medicine is usually prescribed for long-term daily control. Anti-anxiety  medicines may be added in severe cases, especially when panic attacks occur. Talk therapy (psychotherapy). Certain types of talk therapy can be helpful in treating GAD by providing support, education, and guidance. Options include: Cognitive behavioral therapy (CBT). People learn coping skills and self-calming techniques to ease their physical symptoms. They learn to identify unrealistic thoughts and behaviors and to replace them with more appropriate thoughts and behaviors. Acceptance and commitment therapy (ACT). This treatment teaches people how to be mindful as a way to cope with unwanted thoughts and feelings. Biofeedback. This process  trains you to manage your body's response (physiological response) through breathing techniques and relaxation methods. You will work with a therapist while machines are used to monitor your physical symptoms. Stress management techniques. These include yoga, meditation, and exercise. A mental health specialist can help determine which treatment is best for you. Some people see improvement with one type of therapy. However, other peoplerequire a combination of therapies. Follow these instructions at home: Lifestyle Maintain a consistent routine and schedule. Anticipate stressful situations. Create a plan, and allow extra time to work with your plan. Practice stress management or self-calming techniques that you have learned from your therapist or your health care provider. General instructions Take over-the-counter and prescription medicines only as told by your health care provider. Understand that you are likely to have setbacks. Accept this and be kind to yourself as you persist to take better care of yourself. Recognize and accept your accomplishments, even if you judge them as small. Keep all follow-up visits as told by your health care provider. This is important. Contact a health care provider if: Your symptoms do not get better. Your symptoms get worse. You have signs of depression, such as: A persistently sad or irritable mood. Loss of enjoyment in activities that used to bring you joy. Change in weight or eating. Changes in sleeping habits. Avoiding friends or family members. Loss of energy for normal tasks. Feelings of guilt or worthlessness. Get help right away if: You have serious thoughts about hurting yourself or others. If you ever feel like you may hurt yourself or others, or have thoughts about taking your own life, get help right away. Go to your nearest emergency department or: Call your local emergency services (911 in the [place] Call a suicide crisis helpline, such  as the National Suicide Prevention Lifeline at (423) 434-3739. This is open 24 hours a day in the [place] Text the Crisis Text Line at (334)309-0569 (in the [place] Summary Generalized anxiety disorder (GAD) is a mental health condition that involves worry that is not triggered by a specific event. People with GAD often worry excessively about many things in their lives, such as their health and family. GAD may cause symptoms such as restlessness, trouble concentrating, sleep problems, frequent sweating, nausea, diarrhea, headaches, and trembling or muscle twitching. A mental health specialist can help determine which treatment is best for you. Some people see improvement with one type of therapy. However, other people require a combination of therapies. This information is not intended to replace advice given to you by your health care provider. Make sure you discuss any questions you have with your healthcare provider. Document Revised: 11/17/2018 Document Reviewed: 11/17/2018 Elsevier Patient Education  2022 Elsevier Inc. http://APA.org/depression-guideline"> https://clinicalkey.com"> http://point-of-care.elsevierperformancemanager.com/skills/"> http://point-of-care.elsevierperformancemanager.[email or web address]">  Managing Depression, Adult Depression is a mental health condition that affects your thoughts, feelings, and actions. Being diagnosed with depression can bring you relief if you did not know why you have felt or behaved a certain way.  It could also leave you feeling overwhelmed with uncertainty about your future. Preparing yourself tomanage your symptoms can help you feel more positive about your future. How to manage lifestyle changes Managing stress  Stress is your body's reaction to life changes and events, both good and bad. Stress can add to your feelings of depression. Learning to manage your stresscan help lessen your feelings of depression. Try some of the following approaches to reducing your stress  (stress reduction techniques): Listen to music that you enjoy and that inspires you. Try using a meditation app or take a meditation class. Develop a practice that helps you connect with your spiritual self. Walk in nature, pray, or go to a place of worship. Do some deep breathing. To do this, inhale slowly through your nose. Pause at the top of your inhale for a few seconds and then exhale slowly, letting your muscles relax. Practice yoga to help relax and work your muscles. Choose a stress reduction technique that suits your lifestyle and personality. These techniques take time and practice to develop. Set aside 5-15 minutes a day to do them. Therapists can offer training in these techniques. Other things you can do to manage stress include: Keeping a stress diary. Knowing your limits and saying no when you think something is too much. Paying attention to how you react to certain situations. You may not be able to control everything, but you can change your reaction. Adding humor to your life by watching funny films or TV shows. Making time for activities that you enjoy and that relax you.  Medicines Medicines, such as antidepressants, are often a part of treatment for depression. Talk with your pharmacist or health care provider about all the medicines, supplements, and herbal products that you take, their possible side effects, and what medicines and other products are safe to take together. Make sure to report any side effects you may have to your health care provider. Relationships Your health care provider may suggest family therapy, couples therapy, orindividual therapy as part of your treatment. How to recognize changes Everyone responds differently to treatment for depression. As you recover from depression, you may start to: Have more interest in doing activities. Feel less hopeless. Have more energy. Overeat less often, or have a better appetite. Have better mental focus. It is  important to recognize if your depression is not getting better or is getting worse. The symptoms you had in the beginning may return, such as: Tiredness (fatigue) or low energy. Eating too much or too little. Sleeping too much or too little. Feeling restless, agitated, or hopeless. Trouble focusing or making decisions. Unexplained physical complaints. Feeling irritable, angry, or aggressive. If you or your family members notice these symptoms coming back, let yourhealth care provider know right away. Follow these instructions at home: Activity  Try to get some form of exercise each day, such as walking, biking, swimming, or lifting weights. Practice stress reduction techniques. Engage your mind by taking a class or doing some volunteer work.  Lifestyle Get the right amount and quality of sleep. Cut down on using caffeine, tobacco, alcohol, and other potentially harmful substances. Eat a healthy diet that includes plenty of vegetables, fruits, whole grains, low-fat dairy products, and lean protein. Do not eat a lot of foods that are high in solid fats, added sugars, or salt (sodium). General instructions Take over-the-counter and prescription medicines only as told by your health care provider. Keep all follow-up visits as told by your health care provider.  This is important. Where to find support Talking to others  Friends and family members can be sources of support and guidance. Talk to trusted friends or family members about your condition. Explain your symptoms to them, and let them know that you are working with a health care provider to treat your depression. Tell friends and family members how they also can behelpful. Finances Find appropriate mental health providers that fit with your financial situation. Talk with your health care provider about options to get reduced prices on your medicines. Where to find more information You can find support in your area from: Anxiety and  Depression Association of [place] (ADAA): www.adaa.org Mental Health [place]: www.mentalhealthamerica.net [employer] on Mental Illness: www.nami.org Contact a health care provider if: You stop taking your antidepressant medicines, and you have any of these symptoms: Nausea. Headache. Light-headedness. Chills and body aches. Not being able to sleep (insomnia). You or your friends and family think your depression is getting worse. Get help right away if: You have thoughts of hurting yourself or others. If you ever feel like you may hurt yourself or others, or have thoughts about taking your own life, get help right away. Go to your nearest emergency department or: Call your local emergency services (911 in the [place] Call a suicide crisis helpline, such as the National Suicide Prevention Lifeline at 260-455-0427. This is open 24 hours a day in the [place] Text the Crisis Text Line at [phone] (in the [place] Summary If you are diagnosed with depression, preparing yourself to manage your symptoms is a good way to feel positive about your future. Work with your health care provider on a management plan that includes stress reduction techniques, medicines (if applicable), therapy, and healthy lifestyle habits. Keep talking with your health care provider about how your treatment is working. If you have thoughts about taking your own life, call a suicide crisis helpline or text a crisis text line. This information is not intended to replace advice given to you by your health care provider. Make sure you discuss any questions you have with your healthcare provider. Document Revised: 12/08/2018 Document Reviewed: 12/08/2018 Elsevier Patient Education  2022 [employer].

## 2020-08-10 NOTE — Assessment & Plan Note (Signed)
Anxiety well controlled on Celexa 40 mg.  Completed GAD-7

## 2020-08-11 LAB — CBC WITH DIFFERENTIAL/PLATELET
Basophils Absolute: 0.1 10*3/uL (ref 0.0–0.2)
Basos: 1 %
EOS (ABSOLUTE): 0.2 10*3/uL (ref 0.0–0.4)
Eos: 2 %
Hematocrit: 38.5 % (ref 34.0–46.6)
Hemoglobin: 12.6 g/dL (ref 11.1–15.9)
Immature Grans (Abs): 0.1 10*3/uL (ref 0.0–0.1)
Immature Granulocytes: 1 %
Lymphocytes Absolute: 1.7 10*3/uL (ref 0.7–3.1)
Lymphs: 21 %
MCH: 27.9 pg (ref 26.6–33.0)
MCHC: 32.7 g/dL (ref 31.5–35.7)
MCV: 85 fL (ref 79–97)
Monocytes Absolute: 0.5 10*3/uL (ref 0.1–0.9)
Monocytes: 7 %
Neutrophils Absolute: 5.6 10*3/uL (ref 1.4–7.0)
Neutrophils: 68 %
Platelets: 256 10*3/uL (ref 150–450)
RBC: 4.52 x10E6/uL (ref 3.77–5.28)
RDW: 14.4 % (ref 11.7–15.4)
WBC: 8.2 10*3/uL (ref 3.4–10.8)

## 2020-08-11 LAB — LIPID PANEL
Chol/HDL Ratio: 3.6 ratio (ref 0.0–4.4)
Cholesterol, Total: 151 mg/dL (ref 100–199)
HDL: 42 mg/dL (ref >39)
LDL Chol Calc (NIH): 91 mg/dL (ref 0–99)
Triglycerides: 95 mg/dL (ref 0–149)
VLDL Cholesterol Cal: 18 mg/dL (ref 5–40)

## 2020-08-11 LAB — COMPREHENSIVE METABOLIC PANEL
ALT: 14 IU/L (ref 0–32)
AST: 15 IU/L (ref 0–40)
Albumin/Globulin Ratio: 1.6 (ref 1.2–2.2)
Albumin: 4.2 g/dL (ref 3.8–4.8)
Alkaline Phosphatase: 139 IU/L — ABNORMAL HIGH (ref 44–121)
BUN/Creatinine Ratio: 14 (ref 9–23)
BUN: 10 mg/dL (ref 6–24)
Bilirubin Total: 0.4 mg/dL (ref 0.0–1.2)
CO2: 22 mmol/L (ref 20–29)
Calcium: 8.9 mg/dL (ref 8.7–10.2)
Chloride: 103 mmol/L (ref 96–106)
Creatinine, Ser: 0.7 mg/dL (ref 0.57–1.00)
Globulin, Total: 2.7 g/dL (ref 1.5–4.5)
Glucose: 81 mg/dL (ref 65–99)
Potassium: 4 mmol/L (ref 3.5–5.2)
Sodium: 138 mmol/L (ref 134–144)
Total Protein: 6.9 g/dL (ref 6.0–8.5)
eGFR: 108 mL/min/{1.73_m2} (ref >59)

## 2020-08-11 LAB — TSH: TSH: 1.01 u[IU]/mL (ref 0.450–4.500)

## 2020-08-20 ENCOUNTER — Other Ambulatory Visit: Payer: Self-pay | Admitting: *Deleted

## 2020-08-20 MED ORDER — LEVOTHYROXINE SODIUM 125 MCG PO TABS: 125.0000 ug | ORAL_TABLET | Freq: Every day | ORAL | 3 refills | Status: DC

## 2020-09-10 ENCOUNTER — Telehealth: Payer: Self-pay | Admitting: Nurse Practitioner

## 2020-09-10 ENCOUNTER — Other Ambulatory Visit: Payer: Self-pay | Admitting: Nurse Practitioner

## 2020-09-10 DIAGNOSIS — M2550 Pain in unspecified joint: Secondary | ICD-10-CM

## 2020-09-10 MED ORDER — IBUPROFEN 800 MG PO TABS: 800.0000 mg | ORAL_TABLET | Freq: Three times a day (TID) | ORAL | 5 refills | Status: DC | PRN

## 2020-09-10 NOTE — Telephone Encounter (Signed)
  Prescription Request  09/10/2020  What is the name of the medication or equipment? ibuprofen (ADVIL) 800 MG tablet  Have you contacted your pharmacy to request a refill? (if applicable) no  Which pharmacy would you like this sent to? [facility]   Patient notified that their request is being sent to the clinical staff for review and that they should receive a response within 2 business days.

## 2020-10-22 ENCOUNTER — Other Ambulatory Visit: Payer: Self-pay | Admitting: Nurse Practitioner

## 2020-10-22 DIAGNOSIS — F419 Anxiety disorder, unspecified: Secondary | ICD-10-CM

## 2020-10-22 DIAGNOSIS — F339 Major depressive disorder, recurrent, unspecified: Secondary | ICD-10-CM

## 2020-11-15 ENCOUNTER — Other Ambulatory Visit: Payer: Self-pay | Admitting: Nurse Practitioner

## 2020-11-15 DIAGNOSIS — F339 Major depressive disorder, recurrent, unspecified: Secondary | ICD-10-CM

## 2020-11-15 DIAGNOSIS — F419 Anxiety disorder, unspecified: Secondary | ICD-10-CM

## 2020-11-26 ENCOUNTER — Encounter: Payer: Self-pay | Admitting: Nurse Practitioner

## 2020-11-26 ENCOUNTER — Ambulatory Visit: Payer: Commercial Managed Care - PPO | Admitting: Nurse Practitioner

## 2020-11-26 ENCOUNTER — Other Ambulatory Visit: Payer: Self-pay

## 2020-11-26 VITALS — BP 137/86 | HR 79 | Temp 97.6°F | Ht 61.0 in | Wt 241.0 lb

## 2020-11-26 DIAGNOSIS — E039 Hypothyroidism, unspecified: Secondary | ICD-10-CM | POA: Diagnosis not present

## 2020-11-26 DIAGNOSIS — Z23 Encounter for immunization: Secondary | ICD-10-CM

## 2020-11-26 DIAGNOSIS — E782 Mixed hyperlipidemia: Secondary | ICD-10-CM

## 2020-11-26 DIAGNOSIS — F419 Anxiety disorder, unspecified: Secondary | ICD-10-CM

## 2020-11-26 DIAGNOSIS — I10 Essential (primary) hypertension: Secondary | ICD-10-CM | POA: Diagnosis not present

## 2020-11-26 NOTE — Progress Notes (Signed)
Established Patient Office Visit  Subjective:  Patient ID: Angela Newman, female    DOB: July 29, 1975  Age: 45 y.o. MRN: [id number]  CC:  Chief Complaint  Patient presents with   Medical Management of Chronic Issues    HPI Angela Newman presents for Thyroid: Patient presents for evaluation of hypothyroidism and Hypothyroidism. Current symptoms include no new symptoms. Patient denies denies fatigue, weight changes, heat/cold intolerance, bowel/skin changes or CVS symptoms.    Anxiety, Follow-up  She was last seen for anxiety 3 months ago. Changes made at last visit include none.   She reports good compliance with treatment. She reports good tolerance of treatment. She is not having side effects.   She feels her anxiety is mild and Improved since last visit.  Symptoms: No chest pain No difficulty concentrating  No dizziness No fatigue  No feelings of losing control No insomnia  No irritable No palpitations  No panic attacks No racing thoughts  No shortness of breath No sweating  No tremors/shakes    GAD-7 Results GAD-7 Generalized Anxiety Disorder Screening Tool 11/26/2020 08/10/2020 05/09/2020  1. Feeling Nervous, Anxious, or on Edge 1 1 0  2. Not Being Able to Stop or Control Worrying 1 1 0  3. Worrying Too Much About Different Things 1 1 0  4. Trouble Relaxing 1 0 0  5. Being So Restless it's Hard To Sit Still 1 0 0  6. Becoming Easily Annoyed or Irritable 1 2 0  7. Feeling Afraid As If Something Awful Might Happen 1 2 0  Total GAD-7 Score 7 7 0  Difficulty At Work, Home, or Getting  Along With Others? Not difficult at all Not difficult at all -    PHQ-9 Scores PHQ9 SCORE ONLY 11/26/2020 08/10/2020 05/09/2020  PHQ-9 Total Score 7 10 0    Depression, Follow-up  She  was last seen for this 3 months ago. Changes made at last visit include none.   She reports good compliance with treatment. She is not having side effects.   She reports good tolerance of  treatment. Current symptoms include: depressed mood She feels she is Improved since last visit.  Depression screen [facility] 2/9 11/26/2020 08/10/2020 05/09/2020  Decreased Interest 1 1 0  Down, Depressed, Hopeless 1 1 0  PHQ - 2 Score 2 2 0  Altered sleeping 0 3 0  Tired, decreased energy 2 3 0  Change in appetite 1 0 0  Feeling bad or failure about yourself  - 0 0  Trouble concentrating 2 2 0  Moving slowly or fidgety/restless 0 0 0  Suicidal thoughts 0 0 0  PHQ-9 Score 7 10 0  Difficult doing work/chores Somewhat difficult Not difficult at all -    -----------------------------------------------------------------------------------------            Past Medical History:  Diagnosis Date   Anxiety    Depression    Fatigue    Graves disease    Hyperlipidemia    Hypertension    Hypothyroid    Snoring     History reviewed. No pertinent surgical history.  Family History  Problem Relation Age of Onset   Headache Mother    Hypertension Mother    Stroke Mother    Sleep apnea Mother    Cancer Mother        liver   Heart attack Father    Heart attack Sister    Hypertension Sister    Restless legs syndrome Sister    Cancer Sister  Lymphoma Sister    Hypothyroidism Daughter     Social History   Socioeconomic History   Marital status: Married    Spouse name: Not on file   Number of children: Not on file   Years of education: Not on file   Highest education level: Not on file  Occupational History   Not on file  Tobacco Use   Smoking status: Former    Packs/day: 1.00    Types: Cigarettes    Quit date: 09/07/2013    Years since quitting: 7.2   Smokeless tobacco: Never  Vaping Use   Vaping Use: Never used  Substance and Sexual Activity   Alcohol use: No    Alcohol/week: 0.0 standard drinks   Drug use: No   Sexual activity: Yes  Other Topics Concern   Not on file  Social History Narrative   Drinks 2 cups of caffeine daily.   Social Determinants of  Health   Financial Resource Strain: Not on file  Food Insecurity: Not on file  Transportation Needs: Not on file  Physical Activity: Not on file  Stress: Not on file  Social Connections: Not on file  Intimate Partner Violence: Not on file    Outpatient Medications Prior to Visit  Medication Sig Dispense Refill   atorvastatin (LIPITOR) 20 MG tablet Take 1 tablet (20 mg total) by mouth daily. 90 tablet 2   citalopram (CELEXA) 40 MG tablet TAKE 1 TABLET BY MOUTH EVERY DAY 90 tablet 0   etonogestrel (NEXPLANON) 68 MG IMPL implant 1 each by Subdermal route once.     ibuprofen (ADVIL) 800 MG tablet Take 1 tablet (800 mg total) by mouth every 8 (eight) hours as needed. 90 tablet 5   levothyroxine (SYNTHROID) 125 MCG tablet Take 1 tablet (125 mcg total) by mouth daily. 90 tablet 3   Melatonin 5 MG CAPS Take 1 capsule (5 mg total) by mouth at bedtime as needed (insomnia). 60 capsule 1   polyethylene glycol powder (GLYCOLAX/MIRALAX) 17 GM/SCOOP powder Take 17 g by mouth 2 (two) times daily as needed. 850 g 11   No facility-administered medications prior to visit.    Not on File  ROS Review of Systems  Constitutional: Negative.   HENT: Negative.    Eyes: Negative.   Respiratory: Negative.    Cardiovascular: Negative.   Gastrointestinal: Negative.   Skin:  Negative for rash.  Psychiatric/Behavioral:  The patient is nervous/anxious.   All other systems reviewed and are negative.    Objective:    Physical Exam Vitals and nursing note reviewed.  Constitutional:      Appearance: Normal appearance.  HENT:     Head: Normocephalic.     Nose: Nose normal.  Eyes:     Conjunctiva/sclera: Conjunctivae normal.  Cardiovascular:     Rate and Rhythm: Normal rate and regular rhythm.  Pulmonary:     Effort: Pulmonary effort is normal.     Breath sounds: Normal breath sounds.  Abdominal:     General: Bowel sounds are normal.  Skin:    General: Skin is warm.     Findings: No rash.   Neurological:     Mental Status: She is alert and oriented to person, place, and time.  Psychiatric:        Attention and Perception: Attention and perception normal.        Mood and Affect: Mood is anxious and depressed.        Speech: Speech normal.  Behavior: Behavior normal. Behavior is cooperative.    BP 137/86   Pulse 79   Temp 97.6 F (36.4 C) (Temporal)   Ht '5\' 1"'  (1.549 m)   Wt 241 lb (109.3 kg)   BMI 45.54 kg/m  Wt Readings from Last 3 Encounters:  11/26/20 241 lb (109.3 kg)  08/10/20 245 lb (111.1 kg)  05/09/20 245 lb (111.1 kg)     Health Maintenance Due  Topic Date Due   COVID-19 Vaccine (1) Never done   HIV Screening  Never done   Hepatitis C Screening  Never done   PAP SMEAR-Modifier  Never done   COLONOSCOPY (Pts 45-41yr Insurance coverage will need to be confirmed)  Never done    There are no preventive care reminders to display for this patient.  Lab Results  Component Value Date   TSH 1.010 08/10/2020   Lab Results  Component Value Date   WBC 8.2 08/10/2020   HGB 12.6 08/10/2020   HCT 38.5 08/10/2020   MCV 85 08/10/2020   PLT 256 08/10/2020   Lab Results  Component Value Date   NA 138 08/10/2020   K 4.0 08/10/2020   CO2 22 08/10/2020   GLUCOSE 81 08/10/2020   BUN 10 08/10/2020   CREATININE 0.70 08/10/2020   BILITOT 0.4 08/10/2020   ALKPHOS 139 (H) 08/10/2020   AST 15 08/10/2020   ALT 14 08/10/2020   PROT 6.9 08/10/2020   ALBUMIN 4.2 08/10/2020   CALCIUM 8.9 08/10/2020   EGFR 108 08/10/2020   Lab Results  Component Value Date   CHOL 151 08/10/2020   Lab Results  Component Value Date   HDL 42 08/10/2020   Lab Results  Component Value Date   LDLCALC 91 08/10/2020   Lab Results  Component Value Date   TRIG 95 08/10/2020   Lab Results  Component Value Date   CHOLHDL 3.6 08/10/2020   No results found for: HGBA1C    Assessment & Plan:   Problem List Items Addressed This Visit       Cardiovascular and  Mediastinum   Hypertension - Primary    elevated blood pressure well controlled. Continue low sodium diet , weight loss and exercise.         Endocrine   Hypothyroid    Completed TSH- results pending, no new symptoms      Relevant Orders   TSH     Other   Hyperlipidemia    Completed lipid panel - results pending      Relevant Orders   CBC with Differential   Comprehensive metabolic panel   Lipid Panel   Anxiety    No changes to current medication dose, symptoms well controlled      Need for immunization against influenza   Relevant Orders   Flu Vaccine QUAD 660moM (Fluarix, Fluzone & Alfiuria Quad PF) (Completed)    No orders of the defined types were placed in this encounter.   Follow-up: Return in about 3 months (around 02/26/2021).    OnIvy LynnNP

## 2020-11-26 NOTE — Assessment & Plan Note (Signed)
No changes to current medication dose, symptoms well controlled

## 2020-11-26 NOTE — Assessment & Plan Note (Signed)
No changes to current medication dose, symptoms well controlled 

## 2020-11-26 NOTE — Assessment & Plan Note (Signed)
Completed TSH- results pending, no new symptoms

## 2020-11-26 NOTE — Patient Instructions (Signed)
High Cholesterol High cholesterol is a condition in which the blood has high levels of a white, waxy substance similar to fat (cholesterol). The liver makes all the cholesterol that the body needs. The human body needs small amounts of cholesterol to help build cells. A person gets extra or excess cholesterol from the food that he or she eats. The blood carries cholesterol from the liver to the rest of the body. If you have high cholesterol, deposits (plaques) may build up on the walls of your arteries. Arteries are the blood vessels that carry blood away from your heart. These plaques make the arteries narrow and stiff. Cholesterol plaques increase your risk for heart attack and stroke. Work with your health care provider to keep your cholesterol levels in a healthy range. What increases the risk? The following factors may make you more likely to develop this condition: Eating foods that are high in animal fat (saturated fat) or cholesterol. Being overweight. Not getting enough exercise. A family history of high cholesterol (familial hypercholesterolemia). Use of tobacco products. Having diabetes. What are the signs or symptoms? In most cases, high cholesterol does not usually cause any symptoms. In severe cases, very high cholesterol levels can cause: Fatty bumps under the skin (xanthomas). A white or gray ring around the black center (pupil) of the eye. How is this diagnosed? This condition may be diagnosed based on the results of a blood test. If you are older than 46 years of age, your health care provider may check your cholesterol levels every 4-6 years. You may be checked more often if you have high cholesterol or other risk factors for heart disease. The blood test for cholesterol measures: "Bad" cholesterol, or LDL cholesterol. This is the main type of cholesterol that causes heart disease. The desired level is less than 100 mg/dL (2.59 mmol/L). "Good" cholesterol, or HDL  cholesterol. HDL helps protect against heart disease by cleaning the arteries and carrying the LDL to the liver for processing. The desired level for HDL is 60 mg/dL (1.55 mmol/L) or higher. Triglycerides. These are fats that your body can store or burn for energy. The desired level is less than 150 mg/dL (1.69 mmol/L). Total cholesterol. This measures the total amount of cholesterol in your blood and includes LDL, HDL, and triglycerides. The desired level is less than 200 mg/dL (5.17 mmol/L). How is this treated? Treatment for high cholesterol starts with lifestyle changes, such as diet and exercise. Diet changes. You may be asked to eat foods that have more fiber and less saturated fats or added sugar. Lifestyle changes. These may include regular exercise, maintaining a healthy weight, and quitting use of tobacco products. Medicines. These are given when diet and lifestyle changes have not worked. You may be prescribed a statin medicine to help lower your cholesterol levels. Follow these instructions at home: Eating and drinking  Eat a healthy, balanced diet. This diet includes: Daily servings of a variety of fresh, frozen, or canned fruits and vegetables. Daily servings of whole grain foods that are rich in fiber. Foods that are low in saturated fats and trans fats. These include poultry and fish without skin, lean cuts of meat, and low-fat dairy products. A variety of fish, especially oily fish that contain omega-3 fatty acids. Aim to eat fish at least 2 times a week. Avoid foods and drinks that have added sugar. Use healthy cooking methods, such as roasting, grilling, broiling, baking, poaching, steaming, and stir-frying. Do not fry your food except for stir-frying.  If you drink alcohol: Limit how much you have to: 0-1 drink a day for women who are not pregnant. 0-2 drinks a day for men. Know how much alcohol is in a drink. In the [place] one drink equals one 12 oz bottle of beer (355 mL),  one 5 oz glass of wine (148 mL), or one 1 oz glass of hard liquor (44 mL). Lifestyle  Get regular exercise. Aim to exercise for a total of 150 minutes a week. Increase your activity level by doing activities such as gardening, walking, and taking the stairs. Do not use any products that contain nicotine or tobacco. These products include cigarettes, chewing tobacco, and vaping devices, such as e-cigarettes. If you need help quitting, ask your health care provider. General instructions Take over-the-counter and prescription medicines only as told by your health care provider. Keep all follow-up visits. This is important. Where to find more information American Heart Association: www.heart.org National Heart, Lung, and Blood Institute: PopSteam.is Contact a health care provider if: You have trouble achieving or maintaining a healthy diet or weight. You are starting an exercise program. You are unable to stop smoking. Get help right away if: You have chest pain. You have trouble breathing. You have discomfort or pain in your jaw, neck, back, shoulder, or arm. You have any symptoms of a stroke. "BE FAST" is an easy way to remember the main warning signs of a stroke: B - Balance. Signs are dizziness, sudden trouble walking, or loss of balance. E - Eyes. Signs are trouble seeing or a sudden change in vision. F - Face. Signs are sudden weakness or numbness of the face, or the face or eyelid drooping on one side. A - Arms. Signs are weakness or numbness in an arm. This happens suddenly and usually on one side of the body. S - Speech. Signs are sudden trouble speaking, slurred speech, or trouble understanding what people say. T - Time. Time to call emergency services. Write down what time symptoms started. You have other signs of a stroke, such as: A sudden, severe headache with no known cause. Nausea or vomiting. Seizure. These symptoms may represent a serious problem that is an  emergency. Do not wait to see if the symptoms will go away. Get medical help right away. Call your local emergency services (911 in the [place] Do not drive yourself to the hospital. Summary Cholesterol plaques increase your risk for heart attack and stroke. Work with your health care provider to keep your cholesterol levels in a healthy range. Eat a healthy, balanced diet, get regular exercise, and maintain a healthy weight. Do not use any products that contain nicotine or tobacco. These products include cigarettes, chewing tobacco, and vaping devices, such as e-cigarettes. Get help right away if you have any symptoms of a stroke. This information is not intended to replace advice given to you by your health care provider. Make sure you discuss any questions you have with your health care provider. Document Revised: 04/12/2020 Document Reviewed: 04/02/2020 Elsevier Patient Education  2022 [employer] Hypothyroidism Hypothyroidism is when the thyroid gland does not make enough of certain hormones (it is underactive). The thyroid gland is a small gland located in the lower front part of the neck, just in front of the windpipe (trachea). This gland makes hormones that help control how the body uses food for energy (metabolism) as well as how the heart and brain function. These hormones also play a role in keeping your bones strong. When  the thyroid is underactive, it produces too little of the hormones thyroxine (T4) and triiodothyronine (T3). What are the causes? This condition may be caused by: Hashimoto's disease. This is a disease in which the body's disease-fighting system (immune system) attacks the thyroid gland. This is the most common cause. Viral infections. Pregnancy. Certain medicines. Birth defects. Past radiation treatments to the head or neck for cancer. Past treatment with radioactive iodine. Past exposure to radiation in the environment. Past surgical removal of part or all of  the thyroid. Problems with a gland in the center of the brain (pituitary gland). Lack of enough iodine in the diet. What increases the risk? You are more likely to develop this condition if: You are female. You have a family history of thyroid conditions. You use a medicine called lithium. You take medicines that affect the immune system (immunosuppressants). What are the signs or symptoms? Symptoms of this condition include: Feeling as though you have no energy (lethargy). Not being able to tolerate cold. Weight gain that is not explained by a change in diet or exercise habits. Lack of appetite. Dry skin. Coarse hair. Menstrual irregularity. Slowing of thought processes. Constipation. Sadness or depression. How is this diagnosed? This condition may be diagnosed based on: Your symptoms, your medical history, and a physical exam. Blood tests. You may also have imaging tests, such as an ultrasound or MRI. How is this treated? This condition is treated with medicine that replaces the thyroid hormones that your body does not make. After you begin treatment, it may take several weeks for symptoms to go away. Follow these instructions at home: Take over-the-counter and prescription medicines only as told by your health care provider. If you start taking any new medicines, tell your health care provider. Keep all follow-up visits as told by your health care provider. This is important. As your condition improves, your dosage of thyroid hormone medicine may change. You will need to have blood tests regularly so that your health care provider can monitor your condition. Contact a health care provider if: Your symptoms do not get better with treatment. You are taking thyroid hormone replacement medicine and you: Sweat a lot. Have tremors. Feel anxious. Lose weight rapidly. Cannot tolerate heat. Have emotional swings. Have diarrhea. Feel weak. Get help right away if you have: Chest  pain. An irregular heartbeat. A rapid heartbeat. Difficulty breathing. Summary Hypothyroidism is when the thyroid gland does not make enough of certain hormones (it is underactive). When the thyroid is underactive, it produces too little of the hormones thyroxine (T4) and triiodothyronine (T3). The most common cause is Hashimoto's disease, a disease in which the body's disease-fighting system (immune system) attacks the thyroid gland. The condition can also be caused by viral infections, medicine, pregnancy, or past radiation treatment to the head or neck. Symptoms may include weight gain, dry skin, constipation, feeling as though you do not have energy, and not being able to tolerate cold. This condition is treated with medicine to replace the thyroid hormones that your body does not make. This information is not intended to replace advice given to you by your health care provider. Make sure you discuss any questions you have with your health care provider. Document Revised: 10/28/2019 Document Reviewed: 10/13/2019 Elsevier Patient Education  2022 [employer].

## 2020-11-26 NOTE — Assessment & Plan Note (Signed)
Completed lipid panel results pending. 

## 2020-11-26 NOTE — Assessment & Plan Note (Signed)
elevated blood pressure well controlled. Continue low sodium diet , weight loss and exercise.

## 2020-11-27 LAB — COMPREHENSIVE METABOLIC PANEL
ALT: 18 IU/L (ref 0–32)
AST: 18 IU/L (ref 0–40)
Albumin/Globulin Ratio: 1.7 (ref 1.2–2.2)
Albumin: 4.3 g/dL (ref 3.8–4.8)
Alkaline Phosphatase: 146 IU/L — ABNORMAL HIGH (ref 44–121)
BUN/Creatinine Ratio: 17 (ref 9–23)
BUN: 11 mg/dL (ref 6–24)
Bilirubin Total: 0.3 mg/dL (ref 0.0–1.2)
CO2: 24 mmol/L (ref 20–29)
Calcium: 9.4 mg/dL (ref 8.7–10.2)
Chloride: 100 mmol/L (ref 96–106)
Creatinine, Ser: 0.65 mg/dL (ref 0.57–1.00)
Globulin, Total: 2.6 g/dL (ref 1.5–4.5)
Glucose: 84 mg/dL (ref 70–99)
Potassium: 4.2 mmol/L (ref 3.5–5.2)
Sodium: 138 mmol/L (ref 134–144)
Total Protein: 6.9 g/dL (ref 6.0–8.5)
eGFR: 110 mL/min/{1.73_m2} (ref >59)

## 2020-11-27 LAB — CBC WITH DIFFERENTIAL/PLATELET
Basophils Absolute: 0.1 10*3/uL (ref 0.0–0.2)
Basos: 1 %
EOS (ABSOLUTE): 0.2 10*3/uL (ref 0.0–0.4)
Eos: 2 %
Hematocrit: 40.6 % (ref 34.0–46.6)
Hemoglobin: 13.1 g/dL (ref 11.1–15.9)
Immature Grans (Abs): 0 10*3/uL (ref 0.0–0.1)
Immature Granulocytes: 0 %
Lymphocytes Absolute: 1.9 10*3/uL (ref 0.7–3.1)
Lymphs: 20 %
MCH: 27.6 pg (ref 26.6–33.0)
MCHC: 32.3 g/dL (ref 31.5–35.7)
MCV: 86 fL (ref 79–97)
Monocytes Absolute: 0.4 10*3/uL (ref 0.1–0.9)
Monocytes: 5 %
Neutrophils Absolute: 6.8 10*3/uL (ref 1.4–7.0)
Neutrophils: 72 %
Platelets: 302 10*3/uL (ref 150–450)
RBC: 4.74 x10E6/uL (ref 3.77–5.28)
RDW: 13.7 % (ref 11.7–15.4)
WBC: 9.4 10*3/uL (ref 3.4–10.8)

## 2020-11-27 LAB — LIPID PANEL
Chol/HDL Ratio: 3.4 ratio (ref 0.0–4.4)
Cholesterol, Total: 156 mg/dL (ref 100–199)
HDL: 46 mg/dL (ref >39)
LDL Chol Calc (NIH): 95 mg/dL (ref 0–99)
Triglycerides: 79 mg/dL (ref 0–149)
VLDL Cholesterol Cal: 15 mg/dL (ref 5–40)

## 2020-11-27 LAB — TSH: TSH: 1.99 u[IU]/mL (ref 0.450–4.500)

## 2021-02-16 ENCOUNTER — Other Ambulatory Visit: Payer: Self-pay | Admitting: Nurse Practitioner

## 2021-02-16 DIAGNOSIS — F339 Major depressive disorder, recurrent, unspecified: Secondary | ICD-10-CM

## 2021-02-16 DIAGNOSIS — F419 Anxiety disorder, unspecified: Secondary | ICD-10-CM

## 2021-02-19 ENCOUNTER — Other Ambulatory Visit: Payer: Self-pay | Admitting: Nurse Practitioner

## 2021-02-26 ENCOUNTER — Ambulatory Visit: Payer: Commercial Managed Care - PPO | Admitting: Nurse Practitioner

## 2021-02-26 ENCOUNTER — Encounter: Payer: Self-pay | Admitting: Nurse Practitioner

## 2021-02-26 VITALS — BP 118/73 | HR 84 | Temp 98.4°F | Ht 61.0 in | Wt 247.1 lb

## 2021-02-26 DIAGNOSIS — E039 Hypothyroidism, unspecified: Secondary | ICD-10-CM | POA: Diagnosis not present

## 2021-02-26 DIAGNOSIS — E782 Mixed hyperlipidemia: Secondary | ICD-10-CM

## 2021-02-26 DIAGNOSIS — F419 Anxiety disorder, unspecified: Secondary | ICD-10-CM

## 2021-02-26 DIAGNOSIS — I10 Essential (primary) hypertension: Secondary | ICD-10-CM | POA: Diagnosis not present

## 2021-02-26 DIAGNOSIS — M79672 Pain in left foot: Secondary | ICD-10-CM

## 2021-02-26 NOTE — Assessment & Plan Note (Signed)
Depression well-controlled on current medication Celexa 40 mg tablet by mouth daily.  Completed PHQ-9.  Follow-up in 6 weeks.

## 2021-02-26 NOTE — Assessment & Plan Note (Signed)
Hypertension well-controlled.  Continue weight loss, diet and exercise with low-sodium diet.

## 2021-02-26 NOTE — Patient Instructions (Addendum)
Managing Depression, Adult Depression is a mental health condition that affects your thoughts, feelings, and actions. Being diagnosed with depression can bring you relief if you did not know why you have felt or behaved a certain way. It could also leave you feeling overwhelmed with uncertainty about your future. Preparing yourself to manage your symptoms can help you feel more positive about your future. How to manage lifestyle changes Managing stress Stress is your body's reaction to life changes and events, both good and bad. Stress can add to your feelings of depression. Learning to manage your stress can help lessen your feelings of depression. Try some of the following approaches to reducing your stress (stress reduction techniques): Listen to music that you enjoy and that inspires you. Try using a meditation app or take a meditation class. Develop a practice that helps you connect with your spiritual self. Walk in nature, pray, or go to a place of worship. Do some deep breathing. To do this, inhale slowly through your nose. Pause at the top of your inhale for a few seconds and then exhale slowly, letting your muscles relax. Practice yoga to help relax and work your muscles. Choose a stress reduction technique that suits your lifestyle and personality. These techniques take time and practice to develop. Set aside 5-15 minutes a day to do them. Therapists can offer training in these techniques. Other things you can do to manage stress include: Keeping a stress diary. Knowing your limits and saying no when you think something is too much. Paying attention to how you react to certain situations. You may not be able to control everything, but you can change your reaction. Adding humor to your life by watching funny films or TV shows. Making time for activities that you enjoy and that relax you.  Medicines Medicines, such as antidepressants, are often a part of treatment for depression. Talk  with your pharmacist or health care provider about all the medicines, supplements, and herbal products that you take, their possible side effects, and what medicines and other products are safe to take together. Make sure to report any side effects you may have to your health care provider. Relationships Your health care provider may suggest family therapy, couples therapy, or individual therapy as part of your treatment. How to recognize changes Everyone responds differently to treatment for depression. As you recover from depression, you may start to: Have more interest in doing activities. Feel less hopeless. Have more energy. Overeat less often, or have a better appetite. Have better mental focus. It is important to recognize if your depression is not getting better or is getting worse. The symptoms you had in the beginning may return, such as: Tiredness (fatigue) or low energy. Eating too much or too little. Sleeping too much or too little. Feeling restless, agitated, or hopeless. Trouble focusing or making decisions. Unexplained physical complaints. Feeling irritable, angry, or aggressive. If you or your family members notice these symptoms coming back, let your health care provider know right away. Follow these instructions at home: Activity  Try to get some form of exercise each day, such as walking, biking, swimming, or lifting weights. Practice stress reduction techniques. Engage your mind by taking a class or doing some volunteer work. Lifestyle Get the right amount and quality of sleep. Cut down on using caffeine, tobacco, alcohol, and other potentially harmful substances. Eat a healthy diet that includes plenty of vegetables, fruits, whole grains, low-fat dairy products, and lean protein. Do not eat a lot  of foods that are high in solid fats, added sugars, or salt (sodium). General instructions Take over-the-counter and prescription medicines only as told by your health  care provider. Keep all follow-up visits as told by your health care provider. This is important. Where to find support Talking to others Friends and family members can be sources of support and guidance. Talk to trusted friends or family members about your condition. Explain your symptoms to them, and let them know that you are working with a health care provider to treat your depression. Tell friends and family members how they also can be helpful. Finances Find appropriate mental health providers that fit with your financial situation. Talk with your health care provider about options to get reduced prices on your medicines. Where to find more information You can find support in your area from: Anxiety and Depression Association of America (ADAA): www.adaa.org Mental Health America: www.mentalhealthamerica.net [employer] on Mental Illness: www.nami.org Contact a health care provider if: You stop taking your antidepressant medicines, and you have any of these symptoms: Nausea. Headache. Light-headedness. Chills and body aches. Not being able to sleep (insomnia). You or your friends and family think your depression is getting worse. Get help right away if: You have thoughts of hurting yourself or others. If you ever feel like you may hurt yourself or others, or have thoughts about taking your own life, get help right away. Go to your nearest emergency department or: Call your local emergency services (911 in the [place] Call a suicide crisis helpline, such as the National Suicide Prevention Lifeline at 6267774020 or 988 in the [place] This is open 24 hours a day in the [place] Text the Crisis Text Line at (778)120-0590 (in the [place] Summary If you are diagnosed with depression, preparing yourself to manage your symptoms is a good way to feel positive about your future. Work with your health care provider on a management plan that includes stress reduction techniques, medicines (if  applicable), therapy, and healthy lifestyle habits. Keep talking with your health care provider about how your treatment is working. If you have thoughts about taking your own life, call a suicide crisis helpline or text a crisis text line. This information is not intended to replace advice given to you by your health care provider. Make sure you discuss any questions you have with your health care provider. Document Revised: 08/22/2020 Document Reviewed: 12/08/2018 Elsevier Patient Education  2022 [employer] Inc. High Cholesterol High cholesterol is a condition in which the blood has high levels of a white, waxy substance similar to fat (cholesterol). The liver makes all the cholesterol that the body needs. The human body needs small amounts of cholesterol to help build cells. A person gets extra or excess cholesterol from the food that he or she eats. The blood carries cholesterol from the liver to the rest of the body. If you have high cholesterol, deposits (plaques) may build up on the walls of your arteries. Arteries are the blood vessels that carry blood away from your heart. These plaques make the arteries narrow and stiff. Cholesterol plaques increase your risk for heart attack and stroke. Work with your health care provider to keep your cholesterol levels in a healthy range. What increases the risk? The following factors may make you more likely to develop this condition: Eating foods that are high in animal fat (saturated fat) or cholesterol. Being overweight. Not getting enough exercise. A family history of high cholesterol (familial hypercholesterolemia). Use of tobacco products. Having  diabetes. What are the signs or symptoms? In most cases, high cholesterol does not usually cause any symptoms. In severe cases, very high cholesterol levels can cause: Fatty bumps under the skin (xanthomas). A white or gray ring around the black center (pupil) of the eye. How is this diagnosed? This  condition may be diagnosed based on the results of a blood test. If you are older than 47 years of age, your health care provider may check your cholesterol levels every 4-6 years. You may be checked more often if you have high cholesterol or other risk factors for heart disease. The blood test for cholesterol measures: "Bad" cholesterol, or LDL cholesterol. This is the main type of cholesterol that causes heart disease. The desired level is less than 100 mg/dL (9.74 mmol/L). "Good" cholesterol, or HDL cholesterol. HDL helps protect against heart disease by cleaning the arteries and carrying the LDL to the liver for processing. The desired level for HDL is 60 mg/dL (1.63 mmol/L) or higher. Triglycerides. These are fats that your body can store or burn for energy. The desired level is less than 150 mg/dL (8.45 mmol/L). Total cholesterol. This measures the total amount of cholesterol in your blood and includes LDL, HDL, and triglycerides. The desired level is less than 200 mg/dL (3.64 mmol/L). How is this treated? Treatment for high cholesterol starts with lifestyle changes, such as diet and exercise. Diet changes. You may be asked to eat foods that have more fiber and less saturated fats or added sugar. Lifestyle changes. These may include regular exercise, maintaining a healthy weight, and quitting use of tobacco products. Medicines. These are given when diet and lifestyle changes have not worked. You may be prescribed a statin medicine to help lower your cholesterol levels. Follow these instructions at home: Eating and drinking  Eat a healthy, balanced diet. This diet includes: Daily servings of a variety of fresh, frozen, or canned fruits and vegetables. Daily servings of whole grain foods that are rich in fiber. Foods that are low in saturated fats and trans fats. These include poultry and fish without skin, lean cuts of meat, and low-fat dairy products. A variety of fish, especially oily fish  that contain omega-3 fatty acids. Aim to eat fish at least 2 times a week. Avoid foods and drinks that have added sugar. Use healthy cooking methods, such as roasting, grilling, broiling, baking, poaching, steaming, and stir-frying. Do not fry your food except for stir-frying. If you drink alcohol: Limit how much you have to: 0-1 drink a day for women who are not pregnant. 0-2 drinks a day for men. Know how much alcohol is in a drink. In the [place] one drink equals one 12 oz bottle of beer (355 mL), one 5 oz glass of wine (148 mL), or one 1 oz glass of hard liquor (44 mL). Lifestyle  Get regular exercise. Aim to exercise for a total of 150 minutes a week. Increase your activity level by doing activities such as gardening, walking, and taking the stairs. Do not use any products that contain nicotine or tobacco. These products include cigarettes, chewing tobacco, and vaping devices, such as e-cigarettes. If you need help quitting, ask your health care provider. General instructions Take over-the-counter and prescription medicines only as told by your health care provider. Keep all follow-up visits. This is important. Where to find more information American Heart Association: www.heart.org National Heart, Lung, and Blood Institute: PopSteam.is Contact a health care provider if: You have trouble achieving or maintaining a  healthy diet or weight. You are starting an exercise program. You are unable to stop smoking. Get help right away if: You have chest pain. You have trouble breathing. You have discomfort or pain in your jaw, neck, back, shoulder, or arm. You have any symptoms of a stroke. "BE FAST" is an easy way to remember the main warning signs of a stroke: B - Balance. Signs are dizziness, sudden trouble walking, or loss of balance. E - Eyes. Signs are trouble seeing or a sudden change in vision. F - Face. Signs are sudden weakness or numbness of the face, or the face or eyelid  drooping on one side. A - Arms. Signs are weakness or numbness in an arm. This happens suddenly and usually on one side of the body. S - Speech. Signs are sudden trouble speaking, slurred speech, or trouble understanding what people say. T - Time. Time to call emergency services. Write down what time symptoms started. You have other signs of a stroke, such as: A sudden, severe headache with no known cause. Nausea or vomiting. Seizure. These symptoms may represent a serious problem that is an emergency. Do not wait to see if the symptoms will go away. Get medical help right away. Call your local emergency services (911 in the [place] Do not drive yourself to the hospital. Summary Cholesterol plaques increase your risk for heart attack and stroke. Work with your health care provider to keep your cholesterol levels in a healthy range. Eat a healthy, balanced diet, get regular exercise, and maintain a healthy weight. Do not use any products that contain nicotine or tobacco. These products include cigarettes, chewing tobacco, and vaping devices, such as e-cigarettes. Get help right away if you have any symptoms of a stroke. This information is not intended to replace advice given to you by your health care provider. Make sure you discuss any questions you have with your health care provider. Document Revised: 04/12/2020 Document Reviewed: 04/02/2020 Elsevier Patient Education  2022 [employer]

## 2021-02-26 NOTE — Assessment & Plan Note (Signed)
Completed lipid panels results pending.  No new symptoms of hyperlipidemia.

## 2021-02-26 NOTE — Progress Notes (Signed)
Established Patient Office Visit  Subjective:  Patient ID: Angela Newman, female    DOB: 02/24/1975  Age: 46 y.o. MRN: [id number]  CC:  Chief Complaint  Patient presents with   Medical Management of Chronic Issues   Hypothyroidism   Hypertension    HPI Angela Newman presents for  Pt presents for follow up of hypertension. Patient was diagnosed in .  11/26/2020.  Hypertension well-controlled .  Patient not currently taking medication.  Continue low-sodium diet, weight loss and exercise.    Pain  She reports recurrent left heel pain. was not an injury that may have caused the pain. The pain started  few weeks ago and is worsening. The pain does not radiate . The pain is described as aching, is moderate in intensity, occurring intermittently. Symptoms are worse in the: morning, mid-day, afternoon  Aggravating factors: walking Relieving factors: none.  She has tried NSAIDs with little relief.   ---------------------------------------------------------------------------------------------------   Mixed hyperlipidemia  Pt presents with hyperlipidemia. Patient was diagnosed in 2022. Compliance with treatment has been good The patient is compliant with medications, maintains a low cholesterol diet , follows up as directed , and maintains an exercise regimen . The patient denies experiencing any hypercholesterolemia related symptoms.   Current medication atorvastatin 20 mg tablet by mouth daily.  Depression: Depression, Follow-up  She  was last seen for this 3 months ago. Changes made at last visit include Celexa 40 mg tablet by mouth daily..   She reports good compliance with treatment. She is not having side effects.   She reports good tolerance of treatment. Current symptoms include: depressed mood She feels she is Improved since last visit.  Depression screen [facility] 2/9 02/26/2021 11/26/2020 08/10/2020  Decreased Interest _0 Down, Depressed, Hopeless _1 PHQ - 2 Score _2 Altered sleeping 1 0 3  Tired, decreased energy _3 Change in appetite 0 1 0  Feeling bad or failure about yourself  0 - 0  Trouble concentrating _4 Moving slowly or fidgety/restless 0 0 0  Suicidal thoughts 0 0 0  PHQ-9 Score _5 Difficult doing work/chores Not difficult at all Somewhat difficult Not difficult at all    -----------------------------------------------------------------------------------------  .  Past Medical History:  Diagnosis Date   Anxiety    Depression    Fatigue    Graves disease    Hyperlipidemia    Hypertension    Hypothyroid    Snoring     History reviewed. No pertinent surgical history.  Family History  Problem Relation Age of Onset   Headache Mother    Hypertension Mother    Stroke Mother    Sleep apnea Mother    Cancer Mother        liver   Heart attack Father    Heart attack Sister    Hypertension Sister    Restless legs syndrome Sister    Cancer Sister    Lymphoma Sister    Hypothyroidism Daughter     Social History   Socioeconomic History   Marital status: Married    Spouse name: Not on file   Number of children: Not on file   Years of education: Not on file   Highest education level: Not on file  Occupational History   Not on file  Tobacco Use   Smoking status: Former    Packs/day: 1.00    Types: Cigarettes    Quit  date: 09/07/2013    Years since quitting: 7.4   Smokeless tobacco: Never  Vaping Use   Vaping Use: Never used  Substance and Sexual Activity   Alcohol use: No    Alcohol/week: 0.0 standard drinks   Drug use: No   Sexual activity: Yes  Other Topics Concern   Not on file  Social History Narrative   Drinks 2 cups of caffeine daily.   Social Determinants of Health   Financial Resource Strain: Not on file  Food Insecurity: Not on file  Transportation Needs: Not on file  Physical Activity: Not on file  Stress: Not on file  Social Connections: Not on file  Intimate Partner Violence: Not  on file    Outpatient Medications Prior to Visit  Medication Sig Dispense Refill   atorvastatin (LIPITOR) 20 MG tablet Take 1 tablet (20 mg total) by mouth daily. 90 tablet 2   citalopram (CELEXA) 40 MG tablet TAKE 1 TABLET BY MOUTH EVERY DAY 30 tablet 0   [employer] PURELAX 17 GM/SCOOP powder TAKE 17 G BY MOUTH 2 (TWO) TIMES DAILY AS NEEDED. 850 g 5   etonogestrel (NEXPLANON) 68 MG IMPL implant 1 each by Subdermal route once.     ibuprofen (ADVIL) 800 MG tablet Take 1 tablet (800 mg total) by mouth every 8 (eight) hours as needed. 90 tablet 5   levothyroxine (SYNTHROID) 125 MCG tablet Take 1 tablet (125 mcg total) by mouth daily. 90 tablet 3   Melatonin 5 MG CAPS Take 1 capsule (5 mg total) by mouth at bedtime as needed (insomnia). 60 capsule 1   No facility-administered medications prior to visit.    No Known Allergies  ROS Review of Systems  Constitutional: Negative.   HENT: Negative.    Respiratory: Negative.    Cardiovascular: Negative.   Musculoskeletal: Negative.   Skin:  Negative for rash.  Neurological: Negative.   Psychiatric/Behavioral:  The patient is nervous/anxious.   All other systems reviewed and are negative.    Objective:    Physical Exam Vitals and nursing note reviewed.  Constitutional:      Appearance: Normal appearance. She is obese.  HENT:     Head: Normocephalic.     Mouth/Throat:     Mouth: Mucous membranes are moist.     Pharynx: Oropharynx is clear.  Eyes:     Conjunctiva/sclera: Conjunctivae normal.  Cardiovascular:     Rate and Rhythm: Normal rate and regular rhythm.  Pulmonary:     Effort: Pulmonary effort is normal.     Breath sounds: Normal breath sounds.  Abdominal:     General: Bowel sounds are normal.  Musculoskeletal:        General: Normal range of motion.     Left foot: Tenderness present.       Legs:     Comments: Left heel pain  Skin:    Findings: No rash.  Neurological:     Mental Status: She is alert and oriented to  person, place, and time.    BP 118/73    Pulse 84    Temp 98.4 F (36.9 C) (Temporal)    Ht _0  (1.549 m)    Wt 247 lb 2 oz (112.1 kg)    BMI 46.69 kg/m  Wt Readings from Last 3 Encounters:  02/26/21 247 lb 2 oz (112.1 kg)  11/26/20 241 lb (109.3 kg)  08/10/20 245 lb (111.1 kg)     There are no preventive care reminders to display for this patient.  There are no preventive care reminders to display for this patient.  Lab Results  Component Value Date   TSH 1.990 11/26/2020   Lab Results  Component Value Date   WBC 9.4 11/26/2020   HGB 13.1 11/26/2020   HCT 40.6 11/26/2020   MCV 86 11/26/2020   PLT 302 11/26/2020   Lab Results  Component Value Date   NA 138 11/26/2020   K 4.2 11/26/2020   CO2 24 11/26/2020   GLUCOSE 84 11/26/2020   BUN 11 11/26/2020   CREATININE 0.65 11/26/2020   BILITOT 0.3 11/26/2020   ALKPHOS 146 (H) 11/26/2020   AST 18 11/26/2020   ALT 18 11/26/2020   PROT 6.9 11/26/2020   ALBUMIN 4.3 11/26/2020   CALCIUM 9.4 11/26/2020   EGFR 110 11/26/2020   Lab Results  Component Value Date   CHOL 156 11/26/2020   Lab Results  Component Value Date   HDL 46 11/26/2020   Lab Results  Component Value Date   LDLCALC 95 11/26/2020   Lab Results  Component Value Date   TRIG 79 11/26/2020   Lab Results  Component Value Date   CHOLHDL 3.4 11/26/2020   No results found for: HGBA1C    Assessment & Plan:   Problem List Items Addressed This Visit       Cardiovascular and Mediastinum   Hypertension - Primary    Hypertension well-controlled.  Continue weight loss, diet and exercise with low-sodium diet.        Endocrine   Hypothyroid    We will recheck TSH and thyroid in 6 months.  No new signs and symptoms of hypothyroid        Other   Hyperlipidemia    Completed lipid panels results pending.  No new symptoms of hyperlipidemia.      Relevant Orders   CBC with Differential   Comprehensive metabolic panel   Lipid panel   Anxiety     Anxiety well controlled changes to current medication.  Follow-up in 6 months.      Other Visit Diagnoses     Pain of left heel       Relevant Orders   Ambulatory referral to Podiatry       No orders of the defined types were placed in this encounter.   Follow-up: No follow-ups on file.    Ivy Lynn, NP

## 2021-02-26 NOTE — Assessment & Plan Note (Signed)
We will recheck TSH and thyroid in 6 months.  No new signs and symptoms of hypothyroid

## 2021-02-26 NOTE — Assessment & Plan Note (Signed)
Anxiety well controlled changes to current medication.  Follow-up in 6 months.

## 2021-02-27 LAB — LIPID PANEL
Chol/HDL Ratio: 3.5 ratio (ref 0.0–4.4)
Cholesterol, Total: 158 mg/dL (ref 100–199)
HDL: 45 mg/dL (ref >39)
LDL Chol Calc (NIH): 96 mg/dL (ref 0–99)
Triglycerides: 92 mg/dL (ref 0–149)
VLDL Cholesterol Cal: 17 mg/dL (ref 5–40)

## 2021-02-27 LAB — COMPREHENSIVE METABOLIC PANEL
ALT: 15 IU/L (ref 0–32)
AST: 15 IU/L (ref 0–40)
Albumin/Globulin Ratio: 1.7 (ref 1.2–2.2)
Albumin: 4.2 g/dL (ref 3.8–4.8)
Alkaline Phosphatase: 145 IU/L — ABNORMAL HIGH (ref 44–121)
BUN/Creatinine Ratio: 13 (ref 9–23)
BUN: 9 mg/dL (ref 6–24)
Bilirubin Total: 0.2 mg/dL (ref 0.0–1.2)
CO2: 23 mmol/L (ref 20–29)
Calcium: 9.5 mg/dL (ref 8.7–10.2)
Chloride: 104 mmol/L (ref 96–106)
Creatinine, Ser: 0.72 mg/dL (ref 0.57–1.00)
Globulin, Total: 2.5 g/dL (ref 1.5–4.5)
Glucose: 86 mg/dL (ref 70–99)
Potassium: 4.4 mmol/L (ref 3.5–5.2)
Sodium: 142 mmol/L (ref 134–144)
Total Protein: 6.7 g/dL (ref 6.0–8.5)
eGFR: 104 mL/min/{1.73_m2} (ref >59)

## 2021-02-27 LAB — CBC WITH DIFFERENTIAL/PLATELET
Basophils Absolute: 0 10*3/uL (ref 0.0–0.2)
Basos: 0 %
EOS (ABSOLUTE): 0.2 10*3/uL (ref 0.0–0.4)
Eos: 2 %
Hematocrit: 35.9 % (ref 34.0–46.6)
Hemoglobin: 12 g/dL (ref 11.1–15.9)
Immature Grans (Abs): 0 10*3/uL (ref 0.0–0.1)
Immature Granulocytes: 0 %
Lymphocytes Absolute: 1.8 10*3/uL (ref 0.7–3.1)
Lymphs: 19 %
MCH: 28 pg (ref 26.6–33.0)
MCHC: 33.4 g/dL (ref 31.5–35.7)
MCV: 84 fL (ref 79–97)
Monocytes Absolute: 0.6 10*3/uL (ref 0.1–0.9)
Monocytes: 6 %
Neutrophils Absolute: 6.4 10*3/uL (ref 1.4–7.0)
Neutrophils: 73 %
Platelets: 290 10*3/uL (ref 150–450)
RBC: 4.29 x10E6/uL (ref 3.77–5.28)
RDW: 13.6 % (ref 11.7–15.4)
WBC: 9 10*3/uL (ref 3.4–10.8)

## 2021-03-04 ENCOUNTER — Ambulatory Visit (INDEPENDENT_AMBULATORY_CARE_PROVIDER_SITE_OTHER): Payer: Commercial Managed Care - PPO | Admitting: Podiatry

## 2021-03-04 ENCOUNTER — Encounter: Payer: Self-pay | Admitting: Podiatry

## 2021-03-04 ENCOUNTER — Ambulatory Visit (INDEPENDENT_AMBULATORY_CARE_PROVIDER_SITE_OTHER): Payer: Commercial Managed Care - PPO

## 2021-03-04 ENCOUNTER — Other Ambulatory Visit: Payer: Self-pay

## 2021-03-04 DIAGNOSIS — M722 Plantar fascial fibromatosis: Secondary | ICD-10-CM | POA: Diagnosis not present

## 2021-03-04 DIAGNOSIS — M7662 Achilles tendinitis, left leg: Secondary | ICD-10-CM

## 2021-03-04 MED ORDER — DICLOFENAC SODIUM 75 MG PO TBEC: 75.0000 mg | DELAYED_RELEASE_TABLET | Freq: Two times a day (BID) | ORAL | 2 refills | Status: DC

## 2021-03-04 MED ORDER — TRIAMCINOLONE ACETONIDE 10 MG/ML IJ SUSP
10.0000 mg | Freq: Once | INTRAMUSCULAR | Status: AC
  Administered 2021-03-04: 10 mg

## 2021-03-04 NOTE — Patient Instructions (Signed)

## 2021-03-06 NOTE — Progress Notes (Signed)
Subjective:   Patient ID: Angela Newman, female   DOB: 46 y.o.   MRN: [id number]   HPI Patient presents with pain in the posterior aspect the left heel that is been present for around 3 months gradually becoming more aggravating more difficult to wear shoe gear with comfortably.  States that she does not remember specific injury and that it has been confining for her.  Patient does not smoke   Review of Systems  All other systems reviewed and are negative.      Objective:  Physical Exam Vitals and nursing note reviewed.  Constitutional:      Appearance: She is well-developed.  Pulmonary:     Effort: Pulmonary effort is normal.  Musculoskeletal:        General: Normal range of motion.  Skin:    General: Skin is warm.  Neurological:     Mental Status: She is alert.    Neurovascular status intact muscle strength was found to be adequate range of motion adequate with no discomfort upon movement of the Achilles tendon left no indications of muscle strength loss.  Mild equinus condition was noted inflammation pain on the medial side of the posterior heel minimal central or lateral tendon involvement.  Good digital perfusion well oriented x3     Assessment:  Acute Achilles tendinitis left medial side with inflammation     Plan:  H&P x-rays reviewed condition discussed and recommended careful injection explaining chances for risk rupture associated with injection and she is willing to accept risk after I reviewed with her.  I did a sterile prep and carefully injected the medial side 3 mg dexamethasone Kenalog 5 mg Xylocaine and I discussed immobilization and she is going to purchase a air fracture walker to utilize.  She will reduce her activity encouraged to call with questions  X-rays indicate small spur no indication stress fracture arthritis

## 2021-03-14 ENCOUNTER — Other Ambulatory Visit: Payer: Self-pay | Admitting: Nurse Practitioner

## 2021-03-14 DIAGNOSIS — F419 Anxiety disorder, unspecified: Secondary | ICD-10-CM

## 2021-03-14 DIAGNOSIS — F339 Major depressive disorder, recurrent, unspecified: Secondary | ICD-10-CM

## 2021-03-18 ENCOUNTER — Other Ambulatory Visit: Payer: Self-pay | Admitting: Nurse Practitioner

## 2021-03-18 DIAGNOSIS — M2550 Pain in unspecified joint: Secondary | ICD-10-CM

## 2021-05-27 ENCOUNTER — Ambulatory Visit: Payer: Commercial Managed Care - PPO | Admitting: Family Medicine

## 2021-05-27 ENCOUNTER — Other Ambulatory Visit: Payer: Self-pay

## 2021-05-27 DIAGNOSIS — J029 Acute pharyngitis, unspecified: Secondary | ICD-10-CM

## 2021-05-27 DIAGNOSIS — J069 Acute upper respiratory infection, unspecified: Secondary | ICD-10-CM | POA: Diagnosis not present

## 2021-05-27 LAB — CULTURE, GROUP A STREP

## 2021-05-27 LAB — RAPID STREP SCREEN (MED CTR MEBANE ONLY): Strep Gp A Ag, IA W/Reflex: NEGATIVE

## 2021-05-27 MED ORDER — BENZONATATE 100 MG PO CAPS: 100.0000 mg | ORAL_CAPSULE | Freq: Three times a day (TID) | ORAL | 0 refills | Status: DC | PRN

## 2021-05-27 MED ORDER — MAGIC MOUTHWASH W/LIDOCAINE: ORAL | 0 refills | Status: DC

## 2021-05-27 MED ORDER — PROMETHAZINE-DM 6.25-15 MG/5ML PO SYRP: 2.5000 mL | ORAL_SOLUTION | Freq: Four times a day (QID) | ORAL | 0 refills | Status: DC | PRN

## 2021-05-27 NOTE — Progress Notes (Signed)
Telephone visit ? ?Subjective: ?CC: sore throat ?PCP: Ivy Lynn, NP ?[id number] Allegra is a 46 y.o. female calls for telephone consult today. Patient provides verbal consent for consult held via phone. ? ?Due to COVID-19 pandemic this visit was conducted virtually. This visit type was conducted due to national recommendations for restrictions regarding the COVID-19 Pandemic (e.g. social distancing, sheltering in place) in an effort to limit this patient's exposure and mitigate transmission in our community. All issues noted in this document were discussed and addressed.  A physical exam was not performed with this format.  ? ?Location of patient: home ?Location of provider: [facility] ?Others present for call: none ? ?1. Sore throat ?Patient reports onset of sore throat and loss of voice Thursday.  She reports associated headache now and sore throat is more pronounced.  She reports a productive cough with yellow phlegm.  No brown or blood in sputum.  She reports rhinorrhea.  She denies fevers. No known sick contacts but she visited someone in the hospital recently. She has been using nyquil and zyrtec.  No self testing for covid-19.  No testing for strep. ? ? ?ROS: Per HPI ? ?No Known Allergies ?Past Medical History:  ?Diagnosis Date  ? Anxiety   ? Depression   ? Fatigue   ? Graves disease   ? Hyperlipidemia   ? Hypertension   ? Hypothyroid   ? Snoring   ? ? ?Current Outpatient Medications:  ?  atorvastatin (LIPITOR) 20 MG tablet, Take 1 tablet (20 mg total) by mouth daily., Disp: 90 tablet, Rfl: 2 ?  citalopram (CELEXA) 40 MG tablet, TAKE 1 TABLET BY MOUTH EVERY DAY, Disp: 90 tablet, Rfl: 1 ?  [employer] PURELAX 17 GM/SCOOP powder, TAKE 17 G BY MOUTH 2 (TWO) TIMES DAILY AS NEEDED., Disp: 850 g, Rfl: 5 ?  diclofenac (VOLTAREN) 75 MG EC tablet, Take 1 tablet (75 mg total) by mouth 2 (two) times daily., Disp: 50 tablet, Rfl: 2 ?  etonogestrel (NEXPLANON) 68 MG IMPL implant, 1 each by Subdermal route once., Disp: , Rfl:  ?   levothyroxine (SYNTHROID) 125 MCG tablet, Take 1 tablet (125 mcg total) by mouth daily., Disp: 90 tablet, Rfl: 3 ? ?Assessment/ Plan: ?46 y.o. female  ? ?URI with cough and congestion - Plan: Novel Coronavirus, NAA ([facility]), benzonatate (TESSALON PERLES) 100 MG capsule, promethazine-dextromethorphan (PROMETHAZINE-DM) 6.25-15 MG/5ML syrup ? ?Sore throat - Plan: Culture, Group A Strep, Rapid Strep Screen ([facility] ONLY), Novel Coronavirus, NAA ([facility]), magic mouthwash w/lidocaine SOLN ? ?Rapid strep is negative.  Awaiting culture.  In the meantime treat cough with cough Perles, cough syrup if needed.  Magic mouthwash with lidocaine faxed to her pharmacy to help with sore throat.  Continue adequate hydration.  Okay to continue Tylenol and ibuprofen if needed.  Red flag signs and symptoms reviewed and she will follow-up as needed ? ?Start time: 9:15am ?End time: 9:22pm ? ?Total time spent on patient care (including telephone call/ virtual visit): 7 minutes ? ?Janora Norlander, DO ?[facility] ?([phone] ? ?

## 2021-05-27 NOTE — Patient Instructions (Signed)

## 2021-05-27 NOTE — Addendum Note (Signed)
Addended by: Fara Olden on: 05/27/2021 11:15 AM ? ? Modules accepted: Orders ? ?

## 2021-05-28 LAB — NOVEL CORONAVIRUS, NAA: SARS-CoV-2, NAA: NOT DETECTED

## 2021-05-30 LAB — CULTURE, GROUP A STREP: Strep A Culture: NEGATIVE

## 2021-06-12 ENCOUNTER — Other Ambulatory Visit: Payer: Self-pay | Admitting: Podiatry

## 2021-06-12 NOTE — Telephone Encounter (Signed)
Please advise 

## 2021-06-12 NOTE — Telephone Encounter (Signed)
Per physician appointment needed

## 2021-06-12 NOTE — Telephone Encounter (Signed)
Should be seen

## 2021-07-10 ENCOUNTER — Other Ambulatory Visit: Payer: Self-pay | Admitting: Nurse Practitioner

## 2021-07-10 ENCOUNTER — Ambulatory Visit: Payer: Commercial Managed Care - PPO | Admitting: Podiatry

## 2021-07-10 ENCOUNTER — Encounter: Payer: Self-pay | Admitting: Podiatry

## 2021-07-10 DIAGNOSIS — M2550 Pain in unspecified joint: Secondary | ICD-10-CM

## 2021-07-10 DIAGNOSIS — M7662 Achilles tendinitis, left leg: Secondary | ICD-10-CM | POA: Diagnosis not present

## 2021-07-10 MED ORDER — TRIAMCINOLONE ACETONIDE 10 MG/ML IJ SUSP
10.0000 mg | Freq: Once | INTRAMUSCULAR | Status: AC
  Administered 2021-07-10: 10 mg

## 2021-07-10 NOTE — Patient Instructions (Signed)

## 2021-07-10 NOTE — Progress Notes (Signed)
Subjective:   Patient ID: Angela Newman, female   DOB: 46 y.o.   MRN: [id number]   HPI Patient states the pain has moved to the opposite side but heel still hurts admits she did not wear her boot but she has access to one and did not take the anti-inflammatory or do the stretching exercises neuro   ROS      Objective:  Physical Exam  Vascular status intact with pain to the posterior lateral aspect of the heel versus the medial side with + doing much better with the patient who has inflammation and pain at that insertion     Assessment:  Acute lateral Achilles tendinitis with the medial side doing better     Plan:  Reviewed condition discussed the importance of immobilization oral anti-inflammatory stretching exercises dispensed heel lift.  Since it is the opposite side I did discuss injection explaining chances for rupture and she wants to do procedure I went ahead today did sterile prep and did carefully inject not the medial side of the lateral side 3 mg Dexasone Kenalog 5 mg Xylocaine advised on immobilization advised on ice therapy reduced activity will be seen back as needed

## 2021-07-19 ENCOUNTER — Telehealth: Payer: Self-pay | Admitting: Podiatry

## 2021-07-19 NOTE — Telephone Encounter (Signed)
Yes assistant can fit her for it

## 2021-07-19 NOTE — Telephone Encounter (Signed)
Patient is following up, she thought she had a boot at home, but she she not find it, can she come into the office and pick one up ?

## 2021-07-22 NOTE — Telephone Encounter (Signed)
Patient picked up boot(small) today,signed DME financial responsibility form.

## 2021-07-25 ENCOUNTER — Ambulatory Visit: Payer: Commercial Managed Care - PPO | Admitting: Family Medicine

## 2021-07-25 ENCOUNTER — Encounter: Payer: Self-pay | Admitting: Family Medicine

## 2021-07-25 ENCOUNTER — Telehealth: Payer: Self-pay | Admitting: *Deleted

## 2021-07-25 VITALS — BP 129/76 | HR 83 | Temp 98.7°F | Ht 61.0 in | Wt 244.0 lb

## 2021-07-25 DIAGNOSIS — M5441 Lumbago with sciatica, right side: Secondary | ICD-10-CM | POA: Diagnosis not present

## 2021-07-25 MED ORDER — PREDNISONE 20 MG PO TABS: ORAL_TABLET | ORAL | 0 refills | Status: DC

## 2021-07-25 NOTE — Telephone Encounter (Signed)
Patient is calling because she wore boot one day, back bothered her so bad ,had to see an Orthopedist,suggested that an alternative be given and not wear boot any longer. Please advise.

## 2021-07-25 NOTE — Progress Notes (Signed)
Subjective:  Patient ID: Angela Newman, female    DOB: 07-31-1975, 46 y.o.   MRN: [id number]  Patient Care Team: Ivy Lynn, NP as PCP - General (Nurse Practitioner)   Chief Complaint:  Back Pain   HPI: Angela Newman is a 46 y.o. female presenting on 07/25/2021 for Back Pain   Pt presents today with complaints of right lower back pain. States this started 2-3 days ago after she started wearing a CAM boot for achilles tendonitis. States she has stopped wearing the boot but continues to have lower back pain.  Back Pain This is a new problem. The current episode started in the past 7 days. The problem has been waxing and waning since onset. The pain is present in the lumbar spine and gluteal. The quality of the pain is described as aching and shooting. The pain radiates to the right thigh. The pain is moderate. The symptoms are aggravated by bending, twisting, stress, standing and position. Associated symptoms include leg pain and tingling. Pertinent negatives include no abdominal pain, bladder incontinence, bowel incontinence, chest pain, dysuria, fever, headaches, numbness, paresis, paresthesias, pelvic pain, perianal numbness, weakness or weight loss. She has tried ice for the symptoms. The treatment provided no relief.     Relevant past medical, surgical, family, and social history reviewed and updated as indicated.  Allergies and medications reviewed and updated. Data reviewed: Chart in Epic.   Past Medical History:  Diagnosis Date   Anxiety    Depression    Fatigue    Graves disease    Hyperlipidemia    Hypertension    Hypothyroid    Snoring     History reviewed. No pertinent surgical history.  Social History   Socioeconomic History   Marital status: Married    Spouse name: Not on file   Number of children: Not on file   Years of education: Not on file   Highest education level: Not on file  Occupational History   Not on file  Tobacco Use   Smoking  status: Former    Packs/day: 1.00    Types: Cigarettes    Quit date: 09/07/2013    Years since quitting: 7.8   Smokeless tobacco: Never  Vaping Use   Vaping Use: Never used  Substance and Sexual Activity   Alcohol use: No    Alcohol/week: 0.0 standard drinks of alcohol   Drug use: No   Sexual activity: Yes  Other Topics Concern   Not on file  Social History Narrative   Drinks 2 cups of caffeine daily.   Social Determinants of Health   Financial Resource Strain: Not on file  Food Insecurity: Not on file  Transportation Needs: Not on file  Physical Activity: Not on file  Stress: Not on file  Social Connections: Not on file  Intimate Partner Violence: Not on file    Outpatient Encounter Medications as of 07/25/2021  Medication Sig   predniSONE (DELTASONE) 20 MG tablet 2 po at sametime daily for 5 days- start tomorrow   atorvastatin (LIPITOR) 20 MG tablet Take 1 tablet (20 mg total) by mouth daily.   citalopram (CELEXA) 40 MG tablet TAKE 1 TABLET BY MOUTH EVERY DAY   [employer] PURELAX 17 GM/SCOOP powder TAKE 17 G BY MOUTH 2 (TWO) TIMES DAILY AS NEEDED.   diclofenac (VOLTAREN) 75 MG EC tablet Take 1 tablet (75 mg total) by mouth 2 (two) times daily.   etonogestrel (NEXPLANON) 68 MG IMPL implant 1 each by Subdermal  route once.   levothyroxine (SYNTHROID) 125 MCG tablet Take 1 tablet (125 mcg total) by mouth daily.   [DISCONTINUED] benzonatate (TESSALON PERLES) 100 MG capsule Take 1 capsule (100 mg total) by mouth 3 (three) times daily as needed for cough.   [DISCONTINUED] magic mouthwash w/lidocaine SOLN Gargle and spit 2mL every 6 hours as needed for sore throat. 76mL lidocaine, 79mL nystatin, 60mg  hydrocortisone tab, qs benadryl total .   [DISCONTINUED] promethazine-dextromethorphan (PROMETHAZINE-DM) 6.25-15 MG/5ML syrup Take 2.5 mLs by mouth 4 (four) times daily as needed for cough.   No facility-administered encounter medications on file as of 07/25/2021.    No Known  Allergies  Review of Systems  Constitutional:  Positive for activity change. Negative for appetite change, chills, diaphoresis, fatigue, fever, unexpected weight change and weight loss.  HENT: Negative.    Eyes: Negative.   Respiratory:  Negative for cough, chest tightness and shortness of breath.   Cardiovascular:  Negative for chest pain, palpitations and leg swelling.  Gastrointestinal:  Negative for abdominal pain, blood in stool, bowel incontinence, constipation, diarrhea, nausea and vomiting.  Endocrine: Negative.   Genitourinary:  Negative for bladder incontinence, decreased urine volume, difficulty urinating, dysuria, frequency, pelvic pain and urgency.  Musculoskeletal:  Positive for arthralgias and back pain. Negative for gait problem, joint swelling, myalgias, neck pain and neck stiffness.  Skin: Negative.   Allergic/Immunologic: Negative.   Neurological:  Positive for tingling. Negative for dizziness, weakness, numbness, headaches and paresthesias.  Hematological: Negative.   Psychiatric/Behavioral:  Negative for confusion, hallucinations, sleep disturbance and suicidal ideas.   All other systems reviewed and are negative.       Objective:  BP 129/76   Pulse 83   Temp 98.7 F (37.1 C)   Ht 5\' 1"  (1.549 m)   Wt 244 lb (110.7 kg)   SpO2 95%   BMI 46.10 kg/m    Wt Readings from Last 3 Encounters:  07/25/21 244 lb (110.7 kg)  02/26/21 247 lb 2 oz (112.1 kg)  11/26/20 241 lb (109.3 kg)    Physical Exam Vitals and nursing note reviewed.  Constitutional:      General: She is not in acute distress.    Appearance: Normal appearance. She is obese. She is not ill-appearing, toxic-appearing or diaphoretic.  HENT:     Head: Normocephalic and atraumatic.  Eyes:     Pupils: Pupils are equal, round, and reactive to light.  Cardiovascular:     Rate and Rhythm: Normal rate and regular rhythm.     Heart sounds: Normal heart sounds. No murmur heard.    No friction rub. No  gallop.  Pulmonary:     Effort: Pulmonary effort is normal.     Breath sounds: Normal breath sounds.  Musculoskeletal:     Cervical back: Normal.     Thoracic back: Normal.     Lumbar back: Tenderness present. No swelling, edema, deformity, signs of trauma, lacerations, spasms or bony tenderness. Decreased range of motion. Positive right straight leg raise test. Negative left straight leg raise test. No scoliosis.     Right hip: Normal.     Left hip: Normal.     Right lower leg: No edema.     Left lower leg: No edema.  Skin:    General: Skin is warm and dry.     Capillary Refill: Capillary refill takes less than 2 seconds.  Neurological:     General: No focal deficit present.     Mental Status: She is alert  and oriented to person, place, and time.  Psychiatric:        Mood and Affect: Mood normal.        Behavior: Behavior normal.        Thought Content: Thought content normal.        Judgment: Judgment normal.     Results for orders placed or performed in visit on 05/27/21  Rapid Strep Screen ([facility] ONLY)   Specimen: Other   Other  Result Value Ref Range   Strep Gp A Ag, IA W/Reflex Negative Negative  Novel Coronavirus, NAA ([facility])   Specimen: Nasopharyngeal(NP) swabs in vial transport medium  Result Value Ref Range   SARS-CoV-2, NAA Not Detected Not Detected  Culture, Group A Strep   Other  Result Value Ref Range   Strep A Culture CANCELED        Pertinent labs & imaging results that were available during my care of the patient were reviewed by me and considered in my medical decision making.  Assessment & Plan:  Darina was seen today for back pain.  Diagnoses and all orders for this visit:  Acute right-sided low back pain with right-sided sciatica No red flags concerning for cauda equina syndrome. Symptomatic care discussed in detail. Will burst with steroids. Pt aware of red flags. Report any new, worsening, or persistent symptoms.  -     predniSONE  (DELTASONE) 20 MG tablet; 2 po at sametime daily for 5 days- start tomorrow     Continue all other maintenance medications.  Follow up plan: Return if symptoms worsen or fail to improve.   Continue healthy lifestyle choices, including diet (rich in fruits, vegetables, and lean proteins, and low in salt and simple carbohydrates) and exercise (at least 30 minutes of moderate physical activity daily).  Educational handout given for back pain  The above assessment and management plan was discussed with the patient. The patient verbalized understanding of and has agreed to the management plan. Patient is aware to call the clinic if they develop any new symptoms or if symptoms persist or worsen. Patient is aware when to return to the clinic for a follow-up visit. Patient educated on when it is appropriate to go to the emergency department.   Kari Baars, FNP-C [facility] [phone]

## 2021-07-26 NOTE — Telephone Encounter (Signed)
Could try a surgical shoe

## 2021-07-29 NOTE — Telephone Encounter (Signed)
Spoke with patient and she will come in tomorrow to pick up shoe

## 2021-07-30 NOTE — Telephone Encounter (Addendum)
Patient picked up darco shoe today.07/30/21.

## 2021-08-15 ENCOUNTER — Other Ambulatory Visit: Payer: Self-pay | Admitting: Nurse Practitioner

## 2021-08-15 DIAGNOSIS — I10 Essential (primary) hypertension: Secondary | ICD-10-CM

## 2021-08-26 ENCOUNTER — Encounter: Payer: Self-pay | Admitting: Nurse Practitioner

## 2021-08-26 ENCOUNTER — Ambulatory Visit: Payer: Commercial Managed Care - PPO | Admitting: Nurse Practitioner

## 2021-08-26 VITALS — BP 126/84 | HR 83 | Temp 97.7°F | Ht 61.0 in | Wt 234.6 lb

## 2021-08-26 DIAGNOSIS — E039 Hypothyroidism, unspecified: Secondary | ICD-10-CM | POA: Diagnosis not present

## 2021-08-26 DIAGNOSIS — I10 Essential (primary) hypertension: Secondary | ICD-10-CM

## 2021-08-26 DIAGNOSIS — F3289 Other specified depressive episodes: Secondary | ICD-10-CM

## 2021-08-26 DIAGNOSIS — F419 Anxiety disorder, unspecified: Secondary | ICD-10-CM | POA: Diagnosis not present

## 2021-08-26 NOTE — Assessment & Plan Note (Signed)
Depression well-controlled on current medication no changes necessary completed PHQ-9.  Follow-up as needed.

## 2021-08-26 NOTE — Assessment & Plan Note (Signed)
patient's hypertension is well controlled no changes to current medication.  Completed labs-CBC, CMP and lipid panel.  Follow-up in 6 months.

## 2021-08-26 NOTE — Assessment & Plan Note (Signed)
Anxiety well controlled on current medication Celexa 40 mg tablet by mouth daily.  No changes necessary.  Completed GAD-7.  Follow-up in 6 months.

## 2021-08-26 NOTE — Patient Instructions (Signed)
Hypothyroidism  Hypothyroidism is when the thyroid gland does not make enough of certain hormones. This is called an underactive thyroid. The thyroid gland is a small gland located in the lower front part of the neck, just in front of the windpipe (trachea). This gland makes hormones that help control how the body uses food for energy (metabolism) as well as how the heart and brain function. These hormones also play a role in keeping your bones strong. When the thyroid is underactive, it produces too little of the hormones thyroxine (T4) and triiodothyronine (T3). What are the causes? This condition may be caused by: Hashimoto's disease. This is a disease in which the body's disease-fighting system (immune system) attacks the thyroid gland. This is the most common cause. Viral infections. Pregnancy. Certain medicines. Birth defects. Problems with a gland in the center of the brain (pituitary gland). Lack of enough iodine in the diet. Other causes may include: Past radiation treatments to the head or neck for cancer. Past treatment with radioactive iodine. Past exposure to radiation in the environment. Past surgical removal of part or all of the thyroid. What increases the risk? You are more likely to develop this condition if: You are female. You have a family history of thyroid conditions. You use a medicine called lithium. You take medicines that affect the immune system (immunosuppressants). What are the signs or symptoms? Common symptoms of this condition include: Not being able to tolerate cold. Feeling as though you have no energy (lethargy). Lack of appetite. Constipation. Sadness or depression. Weight gain that is not explained by a change in diet or exercise habits. Menstrual irregularity. Dry skin, coarse hair, or brittle nails. Other symptoms may include: Muscle pain. Slowing of thought processes. Poor memory. How is this diagnosed? This condition may be diagnosed  based on: Your symptoms, your medical history, and a physical exam. Blood tests. You may also have imaging tests, such as an ultrasound or MRI. How is this treated? This condition is treated with medicine that replaces the thyroid hormones that your body does not make. After you begin treatment, it may take several weeks for symptoms to go away. Follow these instructions at home: Take over-the-counter and prescription medicines only as told by your health care provider. If you start taking any new medicines, tell your health care provider. Keep all follow-up visits as told by your health care provider. This is important. As your condition improves, your dosage of thyroid hormone medicine may change. You will need to have blood tests regularly so that your health care provider can monitor your condition. Contact a health care provider if: Your symptoms do not get better with treatment. You are taking thyroid hormone replacement medicine and you: Sweat a lot. Have tremors. Feel anxious. Lose weight rapidly. Cannot tolerate heat. Have emotional swings. Have diarrhea. Feel weak. Get help right away if: You have chest pain. You have an irregular heartbeat. You have a rapid heartbeat. You have difficulty breathing. These symptoms may be an emergency. Get help right away. Call 911. Do not wait to see if the symptoms will go away. Do not drive yourself to the hospital. Summary Hypothyroidism is when the thyroid gland does not make enough of certain hormones (it is underactive). When the thyroid is underactive, it produces too little of the hormones thyroxine (T4) and triiodothyronine (T3). The most common cause is Hashimoto's disease, a disease in which the body's disease-fighting system (immune system) attacks the thyroid gland. The condition can also be caused   by viral infections, medicine, pregnancy, or past radiation treatment to the head or neck. Symptoms may include weight gain, dry  skin, constipation, feeling as though you do not have energy, and not being able to tolerate cold. This condition is treated with medicine to replace the thyroid hormones that your body does not make. This information is not intended to replace advice given to you by your health care provider. Make sure you discuss any questions you have with your health care provider. Document Revised: 01/29/2021 Document Reviewed: 01/29/2021 Elsevier Patient Education  2023 [employer]. Hypertension, Adult Hypertension is another name for high blood pressure. High blood pressure forces your heart to work harder to pump blood. This can cause problems over time. There are two numbers in a blood pressure reading. There is a top number (systolic) over a bottom number (diastolic). It is best to have a blood pressure that is below 120/80. What are the causes? The cause of this condition is not known. Some other conditions can lead to high blood pressure. What increases the risk? Some lifestyle factors can make you more likely to develop high blood pressure: Smoking. Not getting enough exercise or physical activity. Being overweight. Having too much fat, sugar, calories, or salt (sodium) in your diet. Drinking too much alcohol. Other risk factors include: Having any of these conditions: Heart disease. Diabetes. High cholesterol. Kidney disease. Obstructive sleep apnea. Having a family history of high blood pressure and high cholesterol. Age. The risk increases with age. Stress. What are the signs or symptoms? High blood pressure may not cause symptoms. Very high blood pressure (hypertensive crisis) may cause: Headache. Fast or uneven heartbeats (palpitations). Shortness of breath. Nosebleed. Vomiting or feeling like you may vomit (nauseous). Changes in how you see. Very bad chest pain. Feeling dizzy. Seizures. How is this treated? This condition is treated by making healthy lifestyle changes, such  as: Eating healthy foods. Exercising more. Drinking less alcohol. Your doctor may prescribe medicine if lifestyle changes do not help enough and if: Your top number is above 130. Your bottom number is above 80. Your personal target blood pressure may vary. Follow these instructions at home: Eating and drinking  If told, follow the DASH eating plan. To follow this plan: Fill one half of your plate at each meal with fruits and vegetables. Fill one fourth of your plate at each meal with whole grains. Whole grains include whole-wheat pasta, brown rice, and whole-grain bread. Eat or drink low-fat dairy products, such as skim milk or low-fat yogurt. Fill one fourth of your plate at each meal with low-fat (lean) proteins. Low-fat proteins include fish, chicken without skin, eggs, beans, and tofu. Avoid fatty meat, cured and processed meat, or chicken with skin. Avoid pre-made or processed food. Limit the amount of salt in your diet to less than 1,500 mg each day. Do not drink alcohol if: Your doctor tells you not to drink. You are pregnant, may be pregnant, or are planning to become pregnant. If you drink alcohol: Limit how much you have to: 0-1 drink a day for women. 0-2 drinks a day for men. Know how much alcohol is in your drink. In the [place] one drink equals one 12 oz bottle of beer (355 mL), one 5 oz glass of wine (148 mL), or one 1 oz glass of hard liquor (44 mL). Lifestyle  Work with your doctor to stay at a healthy weight or to lose weight. Ask your doctor what the best weight is for   you. Get at least 30 minutes of exercise that causes your heart to beat faster (aerobic exercise) most days of the week. This may include walking, swimming, or biking. Get at least 30 minutes of exercise that strengthens your muscles (resistance exercise) at least 3 days a week. This may include lifting weights or doing Pilates. Do not smoke or use any products that contain nicotine or tobacco. If you  need help quitting, ask your doctor. Check your blood pressure at home as told by your doctor. Keep all follow-up visits. Medicines Take over-the-counter and prescription medicines only as told by your doctor. Follow directions carefully. Do not skip doses of blood pressure medicine. The medicine does not work as well if you skip doses. Skipping doses also puts you at risk for problems. Ask your doctor about side effects or reactions to medicines that you should watch for. Contact a doctor if: You think you are having a reaction to the medicine you are taking. You have headaches that keep coming back. You feel dizzy. You have swelling in your ankles. You have trouble with your vision. Get help right away if: You get a very bad headache. You start to feel mixed up (confused). You feel weak or numb. You feel faint. You have very bad pain in your: Chest. Belly (abdomen). You vomit more than once. You have trouble breathing. These symptoms may be an emergency. Get help right away. Call 911. Do not wait to see if the symptoms will go away. Do not drive yourself to the hospital. Summary Hypertension is another name for high blood pressure. High blood pressure forces your heart to work harder to pump blood. For most people, a normal blood pressure is less than 120/80. Making healthy choices can help lower blood pressure. If your blood pressure does not get lower with healthy choices, you may need to take medicine. This information is not intended to replace advice given to you by your health care provider. Make sure you discuss any questions you have with your health care provider. Document Revised: 11/15/2020 Document Reviewed: 11/15/2020 Elsevier Patient Education  2023 Elsevier Inc.  

## 2021-08-26 NOTE — Progress Notes (Signed)
Established Patient Office Visit  Subjective   Patient ID: Angela Newman, female    DOB: 03/28/1975  Age: 46 y.o. MRN: [id number]  Chief Complaint  Patient presents with   Medical Management of Chronic Issues    HPI   Pt presents for follow up of hypertension. Patient was diagnosed in 02/26/2021 the patient is tolerating the medication well without side effects. Compliance with treatment has been good; including taking medication as directed , maintains a healthy diet and regular exercise regimen , and following up as directed.   Hypothyroidism Angela Newman is a 46 y.o. female who presents for follow up of hypothyroidism. Current symptoms: none . Patient denies change in energy level, diarrhea, heat / cold intolerance, and nervousness. Symptoms have improved, beginning 6 months ago.   Angela Newman Office Visit from 08/26/2021 in [facility]  PHQ-9 Total Score 6          08/26/2021    4:01 PM 07/25/2021    4:21 PM 02/26/2021    3:57 PM 11/26/2020    3:00 PM  GAD 7 : Generalized Anxiety Score  Nervous, Anxious, on Edge 1 0 1 1  Control/stop worrying 1 0 0 1  Worry too much - different things 1  0 1  Trouble relaxing 0  0 1  Restless 0  0 1  Easily annoyed or irritable '3  2 1  ' Afraid - awful might happen '1  1 1  ' Total GAD 7 Score '7  4 7  ' Anxiety Difficulty Not difficult at all  Not difficult at all Not difficult at all      Patient Active Problem List   Diagnosis Date Noted   BMI 40.0-44.9, adult ([place]) 02/09/2020   Decreased hemoglobin 11/03/2019   Need for immunization against influenza 11/01/2019   Hypertension    Hypothyroid    Anxiety    Depression    Snoring    Hyperlipidemia    Past Medical History:  Diagnosis Date   Anxiety    Depression    Fatigue    Graves disease    Hyperlipidemia    Hypertension    Hypothyroid    Snoring    History reviewed. No pertinent surgical history. Social History   Tobacco Use   Smoking status:  Former    Packs/day: 1.00    Types: Cigarettes    Quit date: 09/07/2013    Years since quitting: 7.9   Smokeless tobacco: Never  Vaping Use   Vaping Use: Never used  Substance Use Topics   Alcohol use: No    Alcohol/week: 0.0 standard drinks of alcohol   Drug use: No   Social History   Socioeconomic History   Marital status: Married    Spouse name: Not on file   Number of children: Not on file   Years of education: Not on file   Highest education level: Not on file  Occupational History   Not on file  Tobacco Use   Smoking status: Former    Packs/day: 1.00    Types: Cigarettes    Quit date: 09/07/2013    Years since quitting: 7.9   Smokeless tobacco: Never  Vaping Use   Vaping Use: Never used  Substance and Sexual Activity   Alcohol use: No    Alcohol/week: 0.0 standard drinks of alcohol   Drug use: No   Sexual activity: Yes  Other Topics Concern   Not on file  Social History Narrative   Drinks 2 cups  of caffeine daily.   Social Determinants of Health   Financial Resource Strain: Not on file  Food Insecurity: Not on file  Transportation Needs: Not on file  Physical Activity: Not on file  Stress: Not on file  Social Connections: Not on file  Intimate Partner Violence: Not on file   Family Status  Relation Name Status   Mother  Alive   Father  Alive   Sister  Alive   Daughter  Alive   Son  Alive   Family History  Problem Relation Age of Onset   Headache Mother    Hypertension Mother    Stroke Mother    Sleep apnea Mother    Cancer Mother        liver   Heart attack Father    Heart attack Sister    Hypertension Sister    Restless legs syndrome Sister    Cancer Sister    Lymphoma Sister    Hypothyroidism Daughter    No Known Allergies    Review of Systems  Constitutional: Negative.   HENT: Negative.    Eyes: Negative.   Respiratory: Negative.    Cardiovascular: Negative.   Gastrointestinal: Negative.   Genitourinary: Negative.    Musculoskeletal: Negative.   Skin: Negative.  Negative for rash.  All other systems reviewed and are negative.     Objective:     BP 126/84   Pulse 83   Temp 97.7 F (36.5 C)   Ht '5\' 1"'  (1.549 m)   Wt 234 lb 9.6 oz (106.4 kg)   SpO2 96%   BMI 44.33 kg/m  BP Readings from Last 3 Encounters:  08/26/21 126/84  07/25/21 129/76  02/26/21 118/73   Wt Readings from Last 3 Encounters:  08/26/21 234 lb 9.6 oz (106.4 kg)  07/25/21 244 lb (110.7 kg)  02/26/21 247 lb 2 oz (112.1 kg)      Physical Exam Vitals and nursing note reviewed.  Constitutional:      Appearance: Normal appearance.  HENT:     Head: Normocephalic.     Right Ear: External ear normal.     Left Ear: External ear normal.     Nose: Nose normal.     Mouth/Throat:     Mouth: Mucous membranes are moist.     Pharynx: Oropharynx is clear.  Eyes:     Conjunctiva/sclera: Conjunctivae normal.  Cardiovascular:     Rate and Rhythm: Normal rate.     Pulses: Normal pulses.     Heart sounds: Normal heart sounds.  Pulmonary:     Effort: Pulmonary effort is normal.     Breath sounds: Normal breath sounds.  Abdominal:     General: Bowel sounds are normal.  Musculoskeletal:        General: Normal range of motion.  Skin:    General: Skin is warm and dry.  Neurological:     General: No focal deficit present.     Mental Status: She is alert and oriented to person, place, and time.  Psychiatric:        Behavior: Behavior normal.      No results found for any visits on 08/26/21.  Last CBC Lab Results  Component Value Date   WBC 9.0 02/26/2021   HGB 12.0 02/26/2021   HCT 35.9 02/26/2021   MCV 84 02/26/2021   MCH 28.0 02/26/2021   RDW 13.6 02/26/2021   PLT 290 32/44/0102   Last metabolic panel Lab Results  Component Value Date  GLUCOSE 86 02/26/2021   NA 142 02/26/2021   K 4.4 02/26/2021   CL 104 02/26/2021   CO2 23 02/26/2021   BUN 9 02/26/2021   CREATININE 0.72 02/26/2021   EGFR 104 02/26/2021    CALCIUM 9.5 02/26/2021   PROT 6.7 02/26/2021   ALBUMIN 4.2 02/26/2021   LABGLOB 2.5 02/26/2021   AGRATIO 1.7 02/26/2021   BILITOT 0.2 02/26/2021   ALKPHOS 145 (H) 02/26/2021   AST 15 02/26/2021   ALT 15 02/26/2021   Last lipids Lab Results  Component Value Date   CHOL 158 02/26/2021   HDL 45 02/26/2021   LDLCALC 96 02/26/2021   TRIG 92 02/26/2021   CHOLHDL 3.5 02/26/2021   Last thyroid functions Lab Results  Component Value Date   TSH 1.990 11/26/2020      The 10-year ASCVD risk score (Arnett DK, et al., 2019) is: 0.9%    Assessment & Plan:   Problem List Items Addressed This Visit       Cardiovascular and Mediastinum   Hypertension - Primary    patient's hypertension is well controlled no changes to current medication.  Completed labs-CBC, CMP and lipid panel.  Follow-up in 6 months.      Relevant Orders   CBC with Differential   Comprehensive metabolic panel   Lipid Panel   TSH     Endocrine   Hypothyroid    No new signs and symptoms of hypothyroidism.  Completed labs-TSH results pending.        Other   Anxiety    Anxiety well controlled on current medication Celexa 40 mg tablet by mouth daily.  No changes necessary.  Completed GAD-7.  Follow-up in 6 months.        Depression    Depression well-controlled on current medication no changes necessary completed PHQ-9.  Follow-up as needed.       Return in about 6 months (around 02/26/2022).    Ivy Lynn, NP

## 2021-08-26 NOTE — Assessment & Plan Note (Signed)
No new signs and symptoms of hypothyroidism.  Completed labs-TSH results pending.

## 2021-08-27 LAB — COMPREHENSIVE METABOLIC PANEL
ALT: 15 IU/L (ref 0–32)
AST: 15 IU/L (ref 0–40)
Albumin/Globulin Ratio: 2.2 (ref 1.2–2.2)
Albumin: 4.4 g/dL (ref 3.9–4.9)
Alkaline Phosphatase: 145 IU/L — ABNORMAL HIGH (ref 44–121)
BUN/Creatinine Ratio: 16 (ref 9–23)
BUN: 11 mg/dL (ref 6–24)
Bilirubin Total: 0.3 mg/dL (ref 0.0–1.2)
CO2: 21 mmol/L (ref 20–29)
Calcium: 9.4 mg/dL (ref 8.7–10.2)
Chloride: 104 mmol/L (ref 96–106)
Creatinine, Ser: 0.69 mg/dL (ref 0.57–1.00)
Globulin, Total: 2 g/dL (ref 1.5–4.5)
Glucose: 89 mg/dL (ref 70–99)
Potassium: 4 mmol/L (ref 3.5–5.2)
Sodium: 138 mmol/L (ref 134–144)
Total Protein: 6.4 g/dL (ref 6.0–8.5)
eGFR: 108 mL/min/{1.73_m2} (ref >59)

## 2021-08-27 LAB — CBC WITH DIFFERENTIAL/PLATELET
Basophils Absolute: 0 10*3/uL (ref 0.0–0.2)
Basos: 0 %
EOS (ABSOLUTE): 0.1 10*3/uL (ref 0.0–0.4)
Eos: 2 %
Hematocrit: 36.8 % (ref 34.0–46.6)
Hemoglobin: 11.6 g/dL (ref 11.1–15.9)
Immature Grans (Abs): 0 10*3/uL (ref 0.0–0.1)
Immature Granulocytes: 0 %
Lymphocytes Absolute: 1.7 10*3/uL (ref 0.7–3.1)
Lymphs: 22 %
MCH: 26.1 pg — ABNORMAL LOW (ref 26.6–33.0)
MCHC: 31.5 g/dL (ref 31.5–35.7)
MCV: 83 fL (ref 79–97)
Monocytes Absolute: 0.4 10*3/uL (ref 0.1–0.9)
Monocytes: 6 %
Neutrophils Absolute: 5.1 10*3/uL (ref 1.4–7.0)
Neutrophils: 70 %
Platelets: 292 10*3/uL (ref 150–450)
RBC: 4.44 x10E6/uL (ref 3.77–5.28)
RDW: 14.5 % (ref 11.7–15.4)
WBC: 7.4 10*3/uL (ref 3.4–10.8)

## 2021-08-27 LAB — LIPID PANEL
Chol/HDL Ratio: 3.2 ratio (ref 0.0–4.4)
Cholesterol, Total: 136 mg/dL (ref 100–199)
HDL: 43 mg/dL (ref >39)
LDL Chol Calc (NIH): 78 mg/dL (ref 0–99)
Triglycerides: 75 mg/dL (ref 0–149)
VLDL Cholesterol Cal: 15 mg/dL (ref 5–40)

## 2021-08-27 LAB — TSH: TSH: 0.342 u[IU]/mL — ABNORMAL LOW (ref 0.450–4.500)

## 2021-08-28 ENCOUNTER — Other Ambulatory Visit: Payer: Self-pay | Admitting: Nurse Practitioner

## 2021-08-28 DIAGNOSIS — E039 Hypothyroidism, unspecified: Secondary | ICD-10-CM

## 2021-08-28 MED ORDER — LEVOTHYROXINE SODIUM 112 MCG PO TABS: 112.0000 ug | ORAL_TABLET | Freq: Every day | ORAL | 0 refills | Status: DC

## 2021-09-17 ENCOUNTER — Other Ambulatory Visit: Payer: Self-pay | Admitting: Nurse Practitioner

## 2021-09-17 DIAGNOSIS — F339 Major depressive disorder, recurrent, unspecified: Secondary | ICD-10-CM

## 2021-09-17 DIAGNOSIS — F419 Anxiety disorder, unspecified: Secondary | ICD-10-CM

## 2021-11-16 ENCOUNTER — Other Ambulatory Visit: Payer: Self-pay | Admitting: Nurse Practitioner

## 2021-11-20 ENCOUNTER — Other Ambulatory Visit: Payer: Self-pay | Admitting: Nurse Practitioner

## 2021-11-20 DIAGNOSIS — I10 Essential (primary) hypertension: Secondary | ICD-10-CM

## 2021-11-29 ENCOUNTER — Other Ambulatory Visit: Payer: Self-pay | Admitting: Nurse Practitioner

## 2021-11-29 DIAGNOSIS — E039 Hypothyroidism, unspecified: Secondary | ICD-10-CM

## 2022-02-14 ENCOUNTER — Other Ambulatory Visit: Payer: Self-pay | Admitting: Nurse Practitioner

## 2022-02-14 DIAGNOSIS — I10 Essential (primary) hypertension: Secondary | ICD-10-CM

## 2022-02-19 LAB — HM MAMMOGRAPHY

## 2022-02-26 ENCOUNTER — Ambulatory Visit: Payer: Commercial Managed Care - PPO | Admitting: Nurse Practitioner

## 2022-02-26 ENCOUNTER — Telehealth: Payer: Self-pay | Admitting: Family Medicine

## 2022-02-26 DIAGNOSIS — E039 Hypothyroidism, unspecified: Secondary | ICD-10-CM

## 2022-02-26 MED ORDER — LEVOTHYROXINE SODIUM 112 MCG PO TABS: 112.0000 ug | ORAL_TABLET | Freq: Every day | ORAL | 0 refills | Status: DC

## 2022-02-26 NOTE — Telephone Encounter (Signed)
Pt aware refill on Thyroid med sent to pharmacy

## 2022-02-26 NOTE — Telephone Encounter (Signed)
Pt was supposed to see Je today to have her meds refilled but due to provider leaving pt had to r/s her appt. She is scheduled to re-est care with Marjorie Smolder on 1/29 but is out of her thyroid Rx and needs refill sent to [facility] in [place] to last her until her appt.   Please advise.

## 2022-03-10 ENCOUNTER — Encounter: Payer: Self-pay | Admitting: Family Medicine

## 2022-03-10 ENCOUNTER — Ambulatory Visit: Payer: 59 | Admitting: Family Medicine

## 2022-03-10 VITALS — BP 115/70 | HR 79 | Temp 98.1°F | Ht 61.0 in | Wt 213.0 lb

## 2022-03-10 DIAGNOSIS — I10 Essential (primary) hypertension: Secondary | ICD-10-CM | POA: Diagnosis not present

## 2022-03-10 DIAGNOSIS — E039 Hypothyroidism, unspecified: Secondary | ICD-10-CM

## 2022-03-10 DIAGNOSIS — Z6841 Body Mass Index (BMI) 40.0 and over, adult: Secondary | ICD-10-CM

## 2022-03-10 DIAGNOSIS — E782 Mixed hyperlipidemia: Secondary | ICD-10-CM

## 2022-03-10 DIAGNOSIS — Z23 Encounter for immunization: Secondary | ICD-10-CM

## 2022-03-10 DIAGNOSIS — F339 Major depressive disorder, recurrent, unspecified: Secondary | ICD-10-CM

## 2022-03-10 DIAGNOSIS — F411 Generalized anxiety disorder: Secondary | ICD-10-CM | POA: Insufficient documentation

## 2022-03-10 DIAGNOSIS — F419 Anxiety disorder, unspecified: Secondary | ICD-10-CM

## 2022-03-10 MED ORDER — ATORVASTATIN CALCIUM 20 MG PO TABS: 20.0000 mg | ORAL_TABLET | Freq: Every day | ORAL | 3 refills | Status: DC

## 2022-03-10 MED ORDER — CITALOPRAM HYDROBROMIDE 40 MG PO TABS: 40.0000 mg | ORAL_TABLET | Freq: Every day | ORAL | 3 refills | Status: AC

## 2022-03-10 NOTE — Progress Notes (Unsigned)
Established Patient Office Visit  Subjective   Patient ID: Angela Newman, female    DOB: 07-19-1975  Age: 47 y.o. MRN: [id number]  Chief Complaint  Patient presents with   Medical Management of Chronic Issues   Hypothyroidism   Hyperlipidemia    HPI HLD She is on atorvastatin 20 mg daily. Reports compliance. Last LDL was 78.   2. Obesity She is doing intermittent fasting and some natural drops. She is established with [facility]. She is not currently exercising.   3. Hypothyroidism Compliant with medications - Yes Current medications - levothyroxine 112 mcg Bowel habit changes - No Heat or cold intolerance - No Mood changes - No Changes in sleep habits - No Fatigue - No Skin, hair, or nail changes - No Tremor - No Palpitations - No Edema - No Shortness of breath - No  Lab Results  Component Value Date   TSH 0.342 (L) 08/26/2021   4. Anxiety/depression  Reports well controlled with Celexa. Denies side effects.      03/10/2022    4:29 PM 08/26/2021    4:00 PM 07/25/2021    4:22 PM  Depression screen PHQ 2/9  Decreased Interest 0 0 0  Down, Depressed, Hopeless 1 0 0  PHQ - 2 Score 1 0 0  Altered sleeping 2 2 2   Tired, decreased energy 2 2 1   Change in appetite 0 2 0  Feeling bad or failure about yourself  0 0 0  Trouble concentrating 0 0 0  Moving slowly or fidgety/restless 0 0 0  Suicidal thoughts 0 0 0  PHQ-9 Score 5 6 3   Difficult doing work/chores Not difficult at all Not difficult at all Not difficult at all      03/10/2022    4:30 PM 08/26/2021    4:01 PM 07/25/2021    4:21 PM 02/26/2021    3:57 PM  GAD 7 : Generalized Anxiety Score  Nervous, Anxious, on Edge 1 1 0 1  Control/stop worrying 2 1 0 0  Worry too much - different things 2 1  0  Trouble relaxing 0 0  0  Restless 0 0  0  Easily annoyed or irritable 2 3  2   Afraid - awful might happen 2 1  1   Total GAD 7 Score 9 7  4   Anxiety Difficulty Not difficult at all Not difficult at  all  Not difficult at all   She has an appt next week for pap, mammo, and exam with GYN.    ROS As per HPI.    Objective:     BP 115/70   Pulse 79   Temp 98.1 F (36.7 C) (Temporal)   Ht 5\' 1"  (1.549 m)   Wt 213 lb (96.6 kg)   SpO2 97%   BMI 40.25 kg/m  Wt Readings from Last 3 Encounters:  03/10/22 213 lb (96.6 kg)  08/26/21 234 lb 9.6 oz (106.4 kg)  07/25/21 244 lb (110.7 kg)      Physical Exam Vitals and nursing note reviewed.  Constitutional:      General: She is not in acute distress.    Appearance: Normal appearance. She is not ill-appearing.  Neck:     Thyroid: No thyroid mass, thyromegaly or thyroid tenderness.  Cardiovascular:     Rate and Rhythm: Normal rate and regular rhythm.     Pulses: Normal pulses.     Heart sounds: Normal heart sounds. No murmur heard. Pulmonary:     Effort:  Pulmonary effort is normal. No respiratory distress.     Breath sounds: Normal breath sounds.  Abdominal:     General: Bowel sounds are normal. There is no distension.     Palpations: Abdomen is soft. There is no mass.     Tenderness: There is no abdominal tenderness. There is no guarding or rebound.  Musculoskeletal:     Cervical back: Neck supple. No tenderness.     Right lower leg: No edema.     Left lower leg: No edema.  Lymphadenopathy:     Cervical: No cervical adenopathy.  Skin:    General: Skin is warm and dry.  Neurological:     General: No focal deficit present.     Mental Status: She is alert and oriented to person, place, and time.  Psychiatric:        Mood and Affect: Mood normal.        Behavior: Behavior normal.        Thought Content: Thought content normal.        Judgment: Judgment normal.      No results found for any visits on 03/10/22.    The 10-year ASCVD risk score (Arnett DK, et al., 2019) is: 0.6%    Assessment & Plan:   Angela Newman was seen today for medical management of chronic issues, hypothyroidism and hyperlipidemia.  Diagnoses  and all orders for this visit:  Primary hypertension Well controlled without medication.   Mixed hyperlipidemia Last LDL was 78. Continue atorvastatin, diet, exercise. -     atorvastatin (LIPITOR) 20 MG tablet; Take 1 tablet (20 mg total) by mouth daily.  Acquired hypothyroidism Continue levothyroxine. Labs pending.  -     TSH -     T4, Free  Morbid obesity (HCC) Will check A1c today. Diet, exercise.  -     Bayer DCA Hb A1c Waived  Depression, recurrent (HCC) GAD (generalized anxiety disorder) Well controlled on current regimen.  -     citalopram (CELEXA) 40 MG tablet; Take 1 tablet (40 mg total) by mouth daily.  Need for immunization against influenza -     Flu Vaccine QUAD 31mo+IM (Fluarix, Fluzone & Alfiuria Quad PF)   Return in about 3 months (around 06/09/2022) for chronic follow up.   The patient indicates understanding of these issues and agrees with the plan.  Gwenlyn Perking, FNP

## 2022-03-11 LAB — T4, FREE: Free T4: 1.02 ng/dL (ref 0.82–1.77)

## 2022-03-11 LAB — BAYER DCA HB A1C WAIVED: HB A1C (BAYER DCA - WAIVED): 5.3 % (ref 4.8–5.6)

## 2022-03-11 LAB — TSH: TSH: 4.62 u[IU]/mL — ABNORMAL HIGH (ref 0.450–4.500)

## 2022-03-12 ENCOUNTER — Other Ambulatory Visit: Payer: Self-pay | Admitting: Family Medicine

## 2022-03-12 DIAGNOSIS — E039 Hypothyroidism, unspecified: Secondary | ICD-10-CM

## 2022-03-12 MED ORDER — LEVOTHYROXINE SODIUM 112 MCG PO TABS: 112.0000 ug | ORAL_TABLET | Freq: Every day | ORAL | 1 refills | Status: DC

## 2022-06-09 ENCOUNTER — Encounter: Payer: Self-pay | Admitting: Family Medicine

## 2022-06-09 ENCOUNTER — Ambulatory Visit (INDEPENDENT_AMBULATORY_CARE_PROVIDER_SITE_OTHER): Payer: 59 | Admitting: Family Medicine

## 2022-06-09 VITALS — BP 115/69 | HR 69 | Temp 98.4°F | Ht 61.0 in | Wt 194.0 lb

## 2022-06-09 DIAGNOSIS — M545 Low back pain, unspecified: Secondary | ICD-10-CM

## 2022-06-09 DIAGNOSIS — I10 Essential (primary) hypertension: Secondary | ICD-10-CM

## 2022-06-09 DIAGNOSIS — G8929 Other chronic pain: Secondary | ICD-10-CM

## 2022-06-09 DIAGNOSIS — F411 Generalized anxiety disorder: Secondary | ICD-10-CM

## 2022-06-09 DIAGNOSIS — F339 Major depressive disorder, recurrent, unspecified: Secondary | ICD-10-CM

## 2022-06-09 DIAGNOSIS — E039 Hypothyroidism, unspecified: Secondary | ICD-10-CM | POA: Diagnosis not present

## 2022-06-09 DIAGNOSIS — Z0001 Encounter for general adult medical examination with abnormal findings: Secondary | ICD-10-CM

## 2022-06-09 DIAGNOSIS — Z1211 Encounter for screening for malignant neoplasm of colon: Secondary | ICD-10-CM

## 2022-06-09 DIAGNOSIS — Z0000 Encounter for general adult medical examination without abnormal findings: Secondary | ICD-10-CM

## 2022-06-09 DIAGNOSIS — E782 Mixed hyperlipidemia: Secondary | ICD-10-CM

## 2022-06-09 DIAGNOSIS — K5909 Other constipation: Secondary | ICD-10-CM

## 2022-06-09 LAB — BAYER DCA HB A1C WAIVED: HB A1C (BAYER DCA - WAIVED): 5.2 % (ref 4.8–5.6)

## 2022-06-09 MED ORDER — IBUPROFEN 800 MG PO TABS
800.0000 mg | ORAL_TABLET | Freq: Three times a day (TID) | ORAL | 0 refills | Status: DC | PRN
Start: 2022-06-09 — End: 2022-07-14

## 2022-06-09 MED ORDER — POLYETHYLENE GLYCOL 3350 17 GM/SCOOP PO POWD
17.0000 g | Freq: Two times a day (BID) | ORAL | 11 refills | Status: DC | PRN
Start: 2022-06-09 — End: 2023-06-18

## 2022-06-09 NOTE — Progress Notes (Signed)
Complete physical exam  Patient: Angela Newman   DOB: Mar 18, 1975   48 y.o. Female  MRN: [id number]  Subjective:    Chief Complaint  Patient presents with   Medical Management of Chronic Issues   Hypertension   Hypothyroidism    Angela Newman is a 47 y.o. female who presents today for a complete physical exam. She reports consuming a  well balanced  diet. Home exercise routine includes walking and home exercises. She generally feels well. She reports sleeping fairly well. She does not have additional problems to discuss today.   She has been compliant with levothyroxine.   She would like a RX for ibuprofen for prn use for her lower back. She reprots chronic intermittent lower back pain. Denies radiculopathy, injury, fever, changes in bowel or bladder control.   She also would like an RX for miralax. She uses this prn for chronic constipation. She typically have a BM every 2-3 days with straining.      06/09/2022    3:12 PM 03/10/2022    4:29 PM 08/26/2021    4:00 PM  Depression screen PHQ 2/9  Decreased Interest 0 0 0  Down, Depressed, Hopeless 0 1 0  PHQ - 2 Score 0 1 0  Altered sleeping 1 2 2   Tired, decreased energy 1 2 2   Change in appetite 1 0 2  Feeling bad or failure about yourself  0 0 0  Trouble concentrating 0 0 0  Moving slowly or fidgety/restless 0 0 0  Suicidal thoughts 0 0 0  PHQ-9 Score 3 5 6   Difficult doing work/chores Not difficult at all Not difficult at all Not difficult at all      06/09/2022    3:12 PM 03/10/2022    4:30 PM 08/26/2021    4:01 PM 07/25/2021    4:21 PM  GAD 7 : Generalized Anxiety Score  Nervous, Anxious, on Edge 1 1 1  0  Control/stop worrying 1 2 1  0  Worry too much - different things 1 2 1    Trouble relaxing 0 0 0   Restless 0 0 0   Easily annoyed or irritable 1 2 3    Afraid - awful might happen 1 2 1    Total GAD 7 Score 5 9 7    Anxiety Difficulty Not difficult at all Not difficult at all Not difficult at all      Most recent  fall risk assessment:    06/09/2022    3:12 PM  Fall Risk   Falls in the past year? 0     Most recent depression screenings:    06/09/2022    3:12 PM 03/10/2022    4:29 PM  PHQ 2/9 Scores  PHQ - 2 Score 0 1  PHQ- 9 Score 3 5    Vision:Within last year and Dental: No current dental problems and Receives regular dental care  Past Medical History:  Diagnosis Date   Anxiety    Depression    Fatigue    Graves disease    Hyperlipidemia    Hypertension    Hypothyroid    Snoring       Patient Care Team: Gabriel Earing, FNP as PCP - General (Family Medicine)   Outpatient Medications Prior to Visit  Medication Sig   atorvastatin (LIPITOR) 20 MG tablet Take 1 tablet (20 mg total) by mouth daily.   citalopram (CELEXA) 40 MG tablet Take 1 tablet (40 mg total) by mouth daily.   etonogestrel (NEXPLANON) 68 MG  IMPL implant 1 each by Subdermal route once.   levothyroxine (SYNTHROID) 112 MCG tablet Take 1 tablet (112 mcg total) by mouth daily.   No facility-administered medications prior to visit.    ROS Negative unless specially indicated above in HPI.      Objective:     BP 115/69   Pulse 69   Temp 98.4 F (36.9 C) (Temporal)   Ht 5\' 1"  (1.549 m)   Wt 194 lb (88 kg)   SpO2 98%   BMI 36.66 kg/m    Physical Exam Vitals and nursing note reviewed.  Constitutional:      General: She is not in acute distress.    Appearance: Normal appearance. She is not ill-appearing.  HENT:     Head: Normocephalic.     Right Ear: Tympanic membrane, ear canal and external ear normal.     Left Ear: Tympanic membrane, ear canal and external ear normal.     Nose: Nose normal.     Mouth/Throat:     Mouth: Mucous membranes are moist.     Pharynx: Oropharynx is clear.  Eyes:     Extraocular Movements: Extraocular movements intact.     Conjunctiva/sclera: Conjunctivae normal.     Pupils: Pupils are equal, round, and reactive to light.  Neck:     Thyroid: No thyroid mass,  thyromegaly or thyroid tenderness.  Cardiovascular:     Rate and Rhythm: Normal rate and regular rhythm.     Pulses: Normal pulses.     Heart sounds: Normal heart sounds. No murmur heard.    No friction rub. No gallop.  Pulmonary:     Effort: Pulmonary effort is normal.     Breath sounds: Normal breath sounds.  Abdominal:     General: Bowel sounds are normal. There is no distension.     Palpations: Abdomen is soft. There is no mass.     Tenderness: There is no abdominal tenderness. There is no guarding.  Musculoskeletal:     Cervical back: Normal range of motion and neck supple. No tenderness.     Right lower leg: No edema.     Left lower leg: No edema.  Skin:    General: Skin is warm and dry.     Capillary Refill: Capillary refill takes less than 2 seconds.     Findings: No lesion or rash.  Neurological:     General: No focal deficit present.     Mental Status: She is alert and oriented to person, place, and time.     Cranial Nerves: No cranial nerve deficit.     Motor: No weakness.     Gait: Gait normal.  Psychiatric:        Mood and Affect: Mood normal.        Behavior: Behavior normal.        Thought Content: Thought content normal.        Judgment: Judgment normal.      Results for orders placed or performed in visit on 06/09/22  HM MAMMOGRAPHY  Result Value Ref Range   HM Mammogram 0-4 Bi-Rad 0-4 Bi-Rad, Self Reported Normal       Assessment & Plan:    Routine Health Maintenance and Physical Exam  Zorana was seen today for medical management of chronic issues.  Diagnoses and all orders for this visit:  Encounter for well adult exam without abnormal findings  Colon cancer screening -     Cologuard  Primary hypertension Well controlled on current regimen.  -  CBC with Differential/Platelet -     CMP14+EGFR  Mixed hyperlipidemia Labs pending. She is on a statin.  -     Lipid panel  Acquired hypothyroidism On synthroid. Labs pending.  -      TSH -     T4, Free  Morbid obesity (HCC) Diet and exercise. Labs pending.  -     Vitamin D, 25-hydroxy -     Bayer DCA Hb A1c Waived  Depression, recurrent (HCC) GAD (generalized anxiety disorder) Well controlled on current regimen.   Chronic constipation -     polyethylene glycol powder (GLYCOLAX/MIRALAX) 17 GM/SCOOP powder; Take 17 g by mouth 2 (two) times daily as needed.  Chronic bilateral low back pain without sciatica No red flags.  -     ibuprofen (ADVIL) 800 MG tablet; Take 1 tablet (800 mg total) by mouth every 8 (eight) hours as needed.    Immunization History  Administered Date(s) Administered   Influenza,inj,Quad PF,6+ Mos 02/15/2019, 11/01/2019, 11/26/2020, 03/10/2022   Tdap 08/10/2020    Health Maintenance  Topic Date Due   Fecal DNA (Cologuard)  Never done   PAP SMEAR-Modifier  03/11/2023 (Originally 04/06/1995)   Hepatitis C Screening  03/11/2023 (Originally 04/05/1992)   HIV Screening  03/11/2023 (Originally 04/05/1989)   COVID-19 Vaccine (1) 06/25/2023 (Originally 10/04/1975)   INFLUENZA VACCINE  09/11/2022   MAMMOGRAM  02/20/2023   DTaP/Tdap/Td (2 - Td or Tdap) 08/11/2030   HPV VACCINES  Aged Out    Discussed health benefits of physical activity, and encouraged her to engage in regular exercise appropriate for her age and condition.  Problem List Items Addressed This Visit       Cardiovascular and Mediastinum   Hypertension   Relevant Orders   CBC with Differential/Platelet   CMP14+EGFR     Endocrine   Hypothyroid   Relevant Orders   TSH   T4, Free     Other   Hyperlipidemia   Relevant Orders   Lipid panel   Depression, recurrent (HCC)   Morbid obesity (HCC)   Relevant Orders   Vitamin D, 25-hydroxy   Bayer DCA Hb A1c Waived   GAD (generalized anxiety disorder)   Chronic bilateral low back pain without sciatica   Relevant Medications   ibuprofen (ADVIL) 800 MG tablet   Other Visit Diagnoses     Encounter for well adult exam  without abnormal findings    -  Primary   Colon cancer screening       Relevant Orders   Cologuard   Chronic constipation       Relevant Medications   polyethylene glycol powder (GLYCOLAX/MIRALAX) 17 GM/SCOOP powder      Return in about 6 months (around 12/09/2022) for chronic follow up.   The patient indicates understanding of these issues and agrees with the plan.  Gabriel Earing, FNP

## 2022-06-09 NOTE — Patient Instructions (Signed)

## 2022-06-10 LAB — LIPID PANEL
Chol/HDL Ratio: 2.6 ratio (ref 0.0–4.4)
Cholesterol, Total: 123 mg/dL (ref 100–199)
HDL: 48 mg/dL (ref >39)
LDL Chol Calc (NIH): 63 mg/dL (ref 0–99)
Triglycerides: 52 mg/dL (ref 0–149)
VLDL Cholesterol Cal: 12 mg/dL (ref 5–40)

## 2022-06-10 LAB — CBC WITH DIFFERENTIAL/PLATELET
Basophils Absolute: 0 10*3/uL (ref 0.0–0.2)
Basos: 1 %
EOS (ABSOLUTE): 0.1 10*3/uL (ref 0.0–0.4)
Eos: 2 %
Hematocrit: 30.2 % — ABNORMAL LOW (ref 34.0–46.6)
Hemoglobin: 9 g/dL — ABNORMAL LOW (ref 11.1–15.9)
Immature Grans (Abs): 0 10*3/uL (ref 0.0–0.1)
Immature Granulocytes: 0 %
Lymphocytes Absolute: 0.8 10*3/uL (ref 0.7–3.1)
Lymphs: 15 %
MCH: 21.7 pg — ABNORMAL LOW (ref 26.6–33.0)
MCHC: 29.8 g/dL — ABNORMAL LOW (ref 31.5–35.7)
MCV: 73 fL — ABNORMAL LOW (ref 79–97)
Monocytes Absolute: 0.4 10*3/uL (ref 0.1–0.9)
Monocytes: 7 %
Neutrophils Absolute: 3.9 10*3/uL (ref 1.4–7.0)
Neutrophils: 75 %
Platelets: 263 10*3/uL (ref 150–450)
RBC: 4.14 x10E6/uL (ref 3.77–5.28)
RDW: 15.8 % — ABNORMAL HIGH (ref 11.7–15.4)
WBC: 5.2 10*3/uL (ref 3.4–10.8)

## 2022-06-10 LAB — CMP14+EGFR
ALT: 12 IU/L (ref 0–32)
AST: 17 IU/L (ref 0–40)
Albumin/Globulin Ratio: 1.9 (ref 1.2–2.2)
Albumin: 4.3 g/dL (ref 3.9–4.9)
Alkaline Phosphatase: 104 IU/L (ref 44–121)
BUN/Creatinine Ratio: 14 (ref 9–23)
BUN: 11 mg/dL (ref 6–24)
Bilirubin Total: 0.4 mg/dL (ref 0.0–1.2)
CO2: 20 mmol/L (ref 20–29)
Calcium: 8.7 mg/dL (ref 8.7–10.2)
Chloride: 103 mmol/L (ref 96–106)
Creatinine, Ser: 0.77 mg/dL (ref 0.57–1.00)
Globulin, Total: 2.3 g/dL (ref 1.5–4.5)
Glucose: 77 mg/dL (ref 70–99)
Potassium: 3.8 mmol/L (ref 3.5–5.2)
Sodium: 137 mmol/L (ref 134–144)
Total Protein: 6.6 g/dL (ref 6.0–8.5)
eGFR: 95 mL/min/{1.73_m2} (ref >59)

## 2022-06-10 LAB — T4, FREE: Free T4: 1.08 ng/dL (ref 0.82–1.77)

## 2022-06-10 LAB — TSH: TSH: 2.26 u[IU]/mL (ref 0.450–4.500)

## 2022-06-10 LAB — VITAMIN D 25 HYDROXY (VIT D DEFICIENCY, FRACTURES): Vit D, 25-Hydroxy: 40.5 ng/mL (ref 30.0–100.0)

## 2022-06-11 ENCOUNTER — Other Ambulatory Visit: Payer: Self-pay | Admitting: *Deleted

## 2022-06-11 DIAGNOSIS — D649 Anemia, unspecified: Secondary | ICD-10-CM

## 2022-06-12 ENCOUNTER — Other Ambulatory Visit: Payer: 59

## 2022-06-12 LAB — IRON AND TIBC

## 2022-06-12 LAB — FERRITIN

## 2022-06-13 ENCOUNTER — Other Ambulatory Visit: Payer: Self-pay | Admitting: *Deleted

## 2022-06-13 DIAGNOSIS — E611 Iron deficiency: Secondary | ICD-10-CM

## 2022-06-13 LAB — IRON AND TIBC
Iron Saturation: 6 % — CL (ref 15–55)
Iron: 21 ug/dL — ABNORMAL LOW (ref 27–159)
Total Iron Binding Capacity: 337 ug/dL (ref 250–450)

## 2022-06-13 LAB — SPECIMEN STATUS REPORT

## 2022-06-16 ENCOUNTER — Other Ambulatory Visit: Payer: 59

## 2022-06-16 DIAGNOSIS — D649 Anemia, unspecified: Secondary | ICD-10-CM

## 2022-06-17 LAB — FECAL OCCULT BLOOD, IMMUNOCHEMICAL: Fecal Occult Bld: NEGATIVE

## 2022-07-09 ENCOUNTER — Other Ambulatory Visit: Payer: Self-pay

## 2022-07-11 ENCOUNTER — Other Ambulatory Visit: Payer: Self-pay | Admitting: Family Medicine

## 2022-07-11 DIAGNOSIS — G8929 Other chronic pain: Secondary | ICD-10-CM

## 2022-07-11 LAB — COLOGUARD: COLOGUARD: NEGATIVE

## 2022-08-06 ENCOUNTER — Other Ambulatory Visit: Payer: 59

## 2022-08-06 DIAGNOSIS — E611 Iron deficiency: Secondary | ICD-10-CM

## 2022-08-07 LAB — IRON,TIBC AND FERRITIN PANEL
Ferritin: 18 ng/mL (ref 15–150)
Iron Saturation: 41 % (ref 15–55)
Iron: 124 ug/dL (ref 27–159)
Total Iron Binding Capacity: 304 ug/dL (ref 250–450)
UIBC: 180 ug/dL (ref 131–425)

## 2022-08-15 ENCOUNTER — Other Ambulatory Visit: Payer: Self-pay | Admitting: Family Medicine

## 2022-08-15 DIAGNOSIS — E039 Hypothyroidism, unspecified: Secondary | ICD-10-CM

## 2022-09-01 ENCOUNTER — Other Ambulatory Visit: Payer: Self-pay | Admitting: *Deleted

## 2022-09-01 DIAGNOSIS — D649 Anemia, unspecified: Secondary | ICD-10-CM

## 2022-09-02 ENCOUNTER — Other Ambulatory Visit: Payer: 59

## 2022-09-02 DIAGNOSIS — D649 Anemia, unspecified: Secondary | ICD-10-CM

## 2022-09-03 LAB — CBC WITH DIFFERENTIAL/PLATELET
Basophils Absolute: 0.1 10*3/uL (ref 0.0–0.2)
Basos: 1 %
EOS (ABSOLUTE): 0.1 10*3/uL (ref 0.0–0.4)
Eos: 2 %
Hematocrit: 35.7 % (ref 34.0–46.6)
Hemoglobin: 11.4 g/dL (ref 11.1–15.9)
Immature Grans (Abs): 0 10*3/uL (ref 0.0–0.1)
Immature Granulocytes: 0 %
Lymphocytes Absolute: 1.7 10*3/uL (ref 0.7–3.1)
Lymphs: 21 %
MCH: 26.7 pg (ref 26.6–33.0)
MCHC: 31.9 g/dL (ref 31.5–35.7)
MCV: 84 fL (ref 79–97)
Monocytes Absolute: 0.5 10*3/uL (ref 0.1–0.9)
Monocytes: 6 %
Neutrophils Absolute: 5.6 10*3/uL (ref 1.4–7.0)
Neutrophils: 70 %
Platelets: 267 10*3/uL (ref 150–450)
RBC: 4.27 x10E6/uL (ref 3.77–5.28)
RDW: 15.6 % — ABNORMAL HIGH (ref 11.7–15.4)
WBC: 7.9 10*3/uL (ref 3.4–10.8)

## 2022-12-10 ENCOUNTER — Ambulatory Visit: Payer: 59 | Admitting: Family Medicine

## 2023-01-20 ENCOUNTER — Ambulatory Visit: Payer: 59 | Admitting: Physical Therapy

## 2023-01-26 ENCOUNTER — Encounter: Payer: Self-pay | Admitting: Physical Therapy

## 2023-01-26 ENCOUNTER — Other Ambulatory Visit: Payer: Self-pay

## 2023-01-26 ENCOUNTER — Ambulatory Visit: Payer: 59 | Attending: Family Medicine | Admitting: Physical Therapy

## 2023-01-26 DIAGNOSIS — M62830 Muscle spasm of back: Secondary | ICD-10-CM | POA: Insufficient documentation

## 2023-01-26 DIAGNOSIS — M5459 Other low back pain: Secondary | ICD-10-CM | POA: Diagnosis present

## 2023-01-26 NOTE — Therapy (Signed)
OUTPATIENT PHYSICAL THERAPY THORACOLUMBAR EVALUATION   Patient Name: Angela Newman MRN: [id number] DOB:[birth year], 47 y.o., female Today's Date: 01/26/2023  END OF SESSION:  PT End of Session - 01/26/23 1508     Visit Number 1    Number of Visits 12    Date for PT Re-Evaluation 03/09/23    PT Start Time 0250    PT Stop Time 0332    PT Time Calculation (min) 42 min    Activity Tolerance Patient tolerated treatment well    Behavior During Therapy [facility] for tasks assessed/performed             Past Medical History:  Diagnosis Date   Anxiety    Depression    Fatigue    Graves disease    Hyperlipidemia    Hypertension    Hypothyroid    Snoring    History reviewed. No pertinent surgical history. Patient Active Problem List   Diagnosis Date Noted   Chronic bilateral low back pain without sciatica 06/09/2022   Morbid obesity (HCC) 03/10/2022   GAD (generalized anxiety disorder) 03/10/2022   Hypertension    Hypothyroid    Anxiety    Depression, recurrent (HCC)    Snoring    Hyperlipidemia     REFERRING PROVIDER: Betsey Holiday PA.    REFERRING DIAG: Lumbar pain.  Rationale for Evaluation and Treatment: Rehabilitation  THERAPY DIAG:  Other low back pain  Muscle spasm of back  ONSET DATE: November 2024.   SUBJECTIVE:                                                                                                                                                                                           SUBJECTIVE STATEMENT: The patient presents to the clinic with c/o left-sided low back pain that travels into her left buttock, posterior thigh to ankle.  She states the pain came on after dancing in boots at a wedding.  She reports a h/o DDD and low back pain that has always been more centrally located and nothing like the pain she has been experiencing as of late.  She rates her pain as severe today.  Sitting and leaning back help decrease her pain.  Lying flat and  then getting up is extremely painful.    PERTINENT HISTORY:  Per patient she has a h/O lumbar DDD.  Please see above.  PAIN:  Are you having pain? Yes: NPRS scale: 9-10/10 Pain location: Left low back and radiation into posterior left LE to ankle. Pain description: Sharp Aggravating factors: As above. Relieving factors: As above.    PRECAUTIONS: None  RED FLAGS: None  WEIGHT BEARING RESTRICTIONS: Yes .  FALLS:  Has patient fallen in last 6 months? No  LIVING ENVIRONMENT: Lives with: lives with their spouse Lives in: House/apartment Has following equipment at home: None  PLOF: Independent  PATIENT GOALS: Not have this pain.    OBJECTIVE:   POSTURE:  The patient's posture is generally quite good.  PALPATION: Tender over left SIJ region and upper glut.  LUMBAR ROM:   Full active lumbar flexion and extension.    LOWER EXTREMITY ROM:     WNL.  LOWER EXTREMITY MMT:   Normal LE strength.  LUMBAR SPECIAL TESTS:  Equal leg lengths. Pain reproduction with a left SLR test.  (-) FABER test.  LE DTR's are normal.    GAIT: Essentially normal.    TODAY'S TREATMENT:                                                                                                                              DATE: HMP and IFC at 80-150 Hz on 40% scan x 15 minutes to patient's left low back/SIJ region.   Normal modality response following removal of modality.   PATIENT EDUCATION:  Education details:  Person educated:  International aid/development worker:  Education comprehension:   HOME EXERCISE PROGRAM:   ASSESSMENT:  CLINICAL IMPRESSION: The patient presents to [facility] with c/o left-sided low back pain with radiation of pain into her left buttock, posterior thigh to ankle.  The pain started after dancing in boots at a wedding in early November.  Her pain can become severe especially when initially getting out of bed.  She has normal active lumbar flexion and extension.  Her LE DTR's and strength  is normal.  She has increased pain with a left SLR.  She is tender to palpation over left SIJ region and upper gluteal region.  Patient will benefit from skilled physical therapy intervention to address pain and deficits.  OBJECTIVE IMPAIRMENTS: decreased activity tolerance, increased muscle spasms, and pain.   ACTIVITY LIMITATIONS: carrying, lifting, bending, and standing  PARTICIPATION LIMITATIONS: meal prep, cleaning, and laundry  PERSONAL FACTORS: 1 comorbidity: h/o DDD  are also affecting patient's functional outcome.   REHAB POTENTIAL: Good  CLINICAL DECISION MAKING: Evolving/moderate complexity  EVALUATION COMPLEXITY: Low   GOALS:  LONG TERM GOALS: Target date: 03/09/23  Ind with a HEP.  Goal status: INITIAL  2.  Eliminate left LE pain.  Goal status: INITIAL  3.  Perform ADL's with pain not > 3/10.  Goal status: INITIAL  PLAN:  PT FREQUENCY: 2x/week  PT DURATION: 6 weeks  PLANNED INTERVENTIONS: 97110-Therapeutic exercises, 97530- Therapeutic activity, [id number]- Neuromuscular re-education, 97535- Self Care, 82956- Manual therapy, 97014- Electrical stimulation (unattended), 97035- Ultrasound, 21308- Traction (mechanical), Patient/Family education, Dry Needling, Cryotherapy, and Moist heat.  PLAN FOR NEXT SESSION: Combo e'stim/US, STW/M, Core exercise progression, spinal protection    Tawan Corkern, [place], PT 01/26/2023, 5:05 PM

## 2023-01-29 ENCOUNTER — Ambulatory Visit: Payer: 59 | Admitting: Physical Therapy

## 2023-01-29 DIAGNOSIS — M5459 Other low back pain: Secondary | ICD-10-CM

## 2023-01-29 DIAGNOSIS — M62830 Muscle spasm of back: Secondary | ICD-10-CM

## 2023-01-29 NOTE — Therapy (Signed)
OUTPATIENT PHYSICAL THERAPY THORACOLUMBAR EVALUATION   Patient Name: Angela Newman MRN: [id number] DOB:1976/02/03, 47 y.o., female Today's Date: 01/29/2023  END OF SESSION:  PT End of Session - 01/29/23 1640     Visit Number 2    Number of Visits 12    Date for PT Re-Evaluation 03/09/23    PT Start Time 0400    Activity Tolerance Patient tolerated treatment well    Behavior During Therapy [facility] for tasks assessed/performed             Past Medical History:  Diagnosis Date   Anxiety    Depression    Fatigue    Graves disease    Hyperlipidemia    Hypertension    Hypothyroid    Snoring    No past surgical history on file. Patient Active Problem List   Diagnosis Date Noted   Chronic bilateral low back pain without sciatica 06/09/2022   Morbid obesity (HCC) 03/10/2022   GAD (generalized anxiety disorder) 03/10/2022   Hypertension    Hypothyroid    Anxiety    Depression, recurrent (HCC)    Snoring    Hyperlipidemia     REFERRING PROVIDER: Betsey Holiday PA.    REFERRING DIAG: Lumbar pain.  Rationale for Evaluation and Treatment: Rehabilitation  THERAPY DIAG:  Other low back pain  Muscle spasm of back  ONSET DATE: November 2024.   SUBJECTIVE:                                                                                                                                                                                           SUBJECTIVE STATEMENT: Pain low.  PERTINENT HISTORY:  Per patient she has a h/O lumbar DDD.  Please see above.  PAIN:  Are you having pain? Yes: NPRS scale: low/10 Pain location: Left low back and radiation into posterior left LE to ankle. Pain description: Sharp Aggravating factors: As above. Relieving factors: As above.    PRECAUTIONS: None  RED FLAGS: None   WEIGHT BEARING RESTRICTIONS: Yes .  FALLS:  Has patient fallen in last 6 months? No  LIVING ENVIRONMENT: Lives with: lives with their spouse Lives in:  House/apartment Has following equipment at home: None  PLOF: Independent  PATIENT GOALS: Not have this pain.    OBJECTIVE:   POSTURE:  The patient's posture is generally quite good.  PALPATION: Tender over left SIJ region and upper glut.  LUMBAR ROM:   Full active lumbar flexion and extension.    LOWER EXTREMITY ROM:     WNL.  LOWER EXTREMITY MMT:   Normal LE strength.  LUMBAR SPECIAL TESTS:  Equal leg  lengths. Pain reproduction with a left SLR test.  (-) FABER test.  LE DTR's are normal.    GAIT: Essentially normal.    TODAY'S TREATMENT:                                                                                                                              DATE:   01/29/23:  Patient in [facility] position with folded pillow between knees for comfort:   Combo e'stim/US at 1.50 w/cm2 X 12 minutes F/B  stw/m X 11 to patient's left SIJ/proximal gluteal f/b HMP and IFC at 80-150 Hz on 40% scan x 20 minutes.  Normal modality response following removal of modality.   PATIENT EDUCATION:  Education details:  Person educated:  International aid/development worker:  Education comprehension:   HOME EXERCISE PROGRAM:   ASSESSMENT:  CLINICAL IMPRESSION: The patient presented to the clinic with a lowered pain-level.  She had a trigger point in the left upper gluteal musculature with a good response to STW/M.    OBJECTIVE IMPAIRMENTS: decreased activity tolerance, increased muscle spasms, and pain.   ACTIVITY LIMITATIONS: carrying, lifting, bending, and standing  PARTICIPATION LIMITATIONS: meal prep, cleaning, and laundry  PERSONAL FACTORS: 1 comorbidity: h/o DDD  are also affecting patient's functional outcome.   REHAB POTENTIAL: Good  CLINICAL DECISION MAKING: Evolving/moderate complexity  EVALUATION COMPLEXITY: Low   GOALS:  LONG TERM GOALS: Target date: 03/09/23  Ind with a HEP.  Goal status: INITIAL  2.  Eliminate left LE pain.  Goal status: INITIAL  3.  Perform ADL's  with pain not > 3/10.  Goal status: INITIAL  PLAN:  PT FREQUENCY: 2x/week  PT DURATION: 6 weeks  PLANNED INTERVENTIONS: 97110-Therapeutic exercises, 97530- Therapeutic activity, [id number]- Neuromuscular re-education, 97535- Self Care, 87564- Manual therapy, 97014- Electrical stimulation (unattended), 97035- Ultrasound, 33295- Traction (mechanical), Patient/Family education, Dry Needling, Cryotherapy, and Moist heat.  PLAN FOR NEXT SESSION: Combo e'stim/US, STW/M, Core exercise progression, spinal protection    Emit Kuenzel, [place], PT 01/29/2023, 4:46 PM

## 2023-02-05 ENCOUNTER — Ambulatory Visit: Payer: 59

## 2023-02-05 DIAGNOSIS — M62830 Muscle spasm of back: Secondary | ICD-10-CM

## 2023-02-05 DIAGNOSIS — M5459 Other low back pain: Secondary | ICD-10-CM

## 2023-02-05 NOTE — Therapy (Signed)
OUTPATIENT PHYSICAL THERAPY THORACOLUMBAR EVALUATION   Patient Name: Angela Newman MRN: [id number] DOB:May 13, 1975, 47 y.o., female Today's Date: 02/05/2023  END OF SESSION:  PT End of Session - 02/05/23 1435     Visit Number 3    Number of Visits 12    Date for PT Re-Evaluation 03/09/23    PT Start Time 1430    PT Stop Time 1527    PT Time Calculation (min) 57 min    Activity Tolerance Patient tolerated treatment well    Behavior During Therapy [facility] for tasks assessed/performed             Past Medical History:  Diagnosis Date   Anxiety    Depression    Fatigue    Graves disease    Hyperlipidemia    Hypertension    Hypothyroid    Snoring    History reviewed. No pertinent surgical history. Patient Active Problem List   Diagnosis Date Noted   Chronic bilateral low back pain without sciatica 06/09/2022   Morbid obesity (HCC) 03/10/2022   GAD (generalized anxiety disorder) 03/10/2022   Hypertension    Hypothyroid    Anxiety    Depression, recurrent (HCC)    Snoring    Hyperlipidemia     REFERRING PROVIDER: Betsey Holiday PA.    REFERRING DIAG: Lumbar pain.  Rationale for Evaluation and Treatment: Rehabilitation  THERAPY DIAG:  Other low back pain  Muscle spasm of back  ONSET DATE: November 2024.   SUBJECTIVE:                                                                                                                                                                                           SUBJECTIVE STATEMENT: Pt reports 2/10 low back pain today.   PERTINENT HISTORY:  Per patient she has a h/O lumbar DDD.  Please see above.  PAIN:  Are you having pain? Yes: NPRS scale: 2/10 Pain location: Left low back and radiation into posterior left LE to ankle. Pain description: Sharp Aggravating factors: As above. Relieving factors: As above.    PRECAUTIONS: None  RED FLAGS: None   WEIGHT BEARING RESTRICTIONS: Yes .  FALLS:  Has patient fallen  in last 6 months? No  LIVING ENVIRONMENT: Lives with: lives with their spouse Lives in: House/apartment Has following equipment at home: None  PLOF: Independent  PATIENT GOALS: Not have this pain.    OBJECTIVE:   POSTURE:  The patient's posture is generally quite good.  PALPATION: Tender over left SIJ region and upper glut.  LUMBAR ROM:   Full active lumbar flexion and extension.    LOWER EXTREMITY  ROM:     WNL.  LOWER EXTREMITY MMT:   Normal LE strength.  LUMBAR SPECIAL TESTS:  Equal leg lengths. Pain reproduction with a left SLR test.  (-) FABER test.  LE DTR's are normal.    GAIT: Essentially normal.    TODAY'S TREATMENT:                                                                                                                              DATE:                      02/05/23              EXERCISE LOG  Exercise Repetitions and Resistance Comments  Nustep Lvl 3 x 20 mins   Rockerboard    LAQs 2# x 25 reps bil    Seated Marches 2# x 25 reps bil   Seated Hip Abduction Red x 3 mins   Seated Hip Adduction 3 mins   Seated Ham Curls Red x 25 reps bil   STS     Blank cell = exercise not performed today   Modalities  Date:  Unattended Estim: Lumbar, IFC 80-150 Hz, 15 mins, Pain Hot Pack: Lumbar, 15 mins, Pain and Tone  01/29/23:  Patient in [facility] position with folded pillow between knees for comfort:   Combo e'stim/US at 1.50 w/cm2 X 12 minutes F/B  stw/m X 11 to patient's left SIJ/proximal gluteal f/b HMP and IFC at 80-150 Hz on 40% scan x 20 minutes.  Normal modality response following removal of modality.   PATIENT EDUCATION:  Education details:  Person educated:  International aid/development worker:  Education comprehension:   HOME EXERCISE PROGRAM:   ASSESSMENT:  CLINICAL IMPRESSION: Pt arrives for today's treatment session reporting 2/10 left low back pain. Pt introduced to Nustep for warm up today without any pain or discomfort.  Pt introduced to several  seated exercises today with min cues required for proper technique and posture.  Normal responses to estim and Mh noted upon removal.  Pt reported decreased pain at completion of today's treatment session.   OBJECTIVE IMPAIRMENTS: decreased activity tolerance, increased muscle spasms, and pain.   ACTIVITY LIMITATIONS: carrying, lifting, bending, and standing  PARTICIPATION LIMITATIONS: meal prep, cleaning, and laundry  PERSONAL FACTORS: 1 comorbidity: h/o DDD  are also affecting patient's functional outcome.   REHAB POTENTIAL: Good  CLINICAL DECISION MAKING: Evolving/moderate complexity  EVALUATION COMPLEXITY: Low   GOALS:  LONG TERM GOALS: Target date: 03/09/23  Ind with a HEP.  Goal status: INITIAL  2.  Eliminate left LE pain.  Goal status: INITIAL  3.  Perform ADL's with pain not > 3/10.  Goal status: INITIAL  PLAN:  PT FREQUENCY: 2x/week  PT DURATION: 6 weeks  PLANNED INTERVENTIONS: 97110-Therapeutic exercises, 97530- Therapeutic activity, [id number]- Neuromuscular re-education, 97535- Self Care, 95621- Manual therapy, 97014- Electrical stimulation (unattended), 97035- Ultrasound, 30865- Traction (mechanical), Patient/Family education, Dry Needling,  Cryotherapy, and Moist heat.  PLAN FOR NEXT SESSION: Combo e'stim/US, STW/M, Core exercise progression, spinal protection   [name], PTA 02/05/2023, 4:13 PM

## 2023-02-09 ENCOUNTER — Ambulatory Visit: Payer: 59

## 2023-02-09 DIAGNOSIS — M5459 Other low back pain: Secondary | ICD-10-CM | POA: Diagnosis not present

## 2023-02-09 DIAGNOSIS — M62830 Muscle spasm of back: Secondary | ICD-10-CM

## 2023-02-09 NOTE — Therapy (Signed)
OUTPATIENT PHYSICAL THERAPY THORACOLUMBAR EVALUATION   Patient Name: Angela Newman MRN: [id number] DOB:[birth year], 47 y.o., female Today's Date: 02/09/2023  END OF SESSION:  PT End of Session - 02/09/23 1603     Visit Number 4    Number of Visits 12    Date for PT Re-Evaluation 03/09/23    PT Start Time 1600    Activity Tolerance Patient tolerated treatment well    Behavior During Therapy [facility] for tasks assessed/performed             Past Medical History:  Diagnosis Date   Anxiety    Depression    Fatigue    Graves disease    Hyperlipidemia    Hypertension    Hypothyroid    Snoring    History reviewed. No pertinent surgical history. Patient Active Problem List   Diagnosis Date Noted   Chronic bilateral low back pain without sciatica 06/09/2022   Morbid obesity (HCC) 03/10/2022   GAD (generalized anxiety disorder) 03/10/2022   Hypertension    Hypothyroid    Anxiety    Depression, recurrent (HCC)    Snoring    Hyperlipidemia     REFERRING PROVIDER: Betsey Holiday PA.    REFERRING DIAG: Lumbar pain.  Rationale for Evaluation and Treatment: Rehabilitation  THERAPY DIAG:  Other low back pain  Muscle spasm of back  ONSET DATE: November 2024.   SUBJECTIVE:                                                                                                                                                                                           SUBJECTIVE STATEMENT: Pt reports 1/10 low back pain today.   PERTINENT HISTORY:  Per patient she has a h/O lumbar DDD.  Please see above.  PAIN:  Are you having pain? Yes: NPRS scale: 1/10 Pain location: Left low back and radiation into posterior left LE to ankle. Pain description: Sharp Aggravating factors: As above. Relieving factors: As above.    PRECAUTIONS: None  RED FLAGS: None   WEIGHT BEARING RESTRICTIONS: Yes .  FALLS:  Has patient fallen in last 6 months? No  LIVING ENVIRONMENT: Lives with:  lives with their spouse Lives in: House/apartment Has following equipment at home: None  PLOF: Independent  PATIENT GOALS: Not have this pain.    OBJECTIVE:   POSTURE:  The patient's posture is generally quite good.  PALPATION: Tender over left SIJ region and upper glut.  LUMBAR ROM:   Full active lumbar flexion and extension.    LOWER EXTREMITY ROM:     WNL.  LOWER EXTREMITY MMT:   Normal LE strength.  LUMBAR SPECIAL TESTS:  Equal leg lengths. Pain reproduction with a left SLR test.  (-) FABER test.  LE DTR's are normal.  GAIT: Essentially normal.    TODAY'S TREATMENT:                                                                                                                              DATE:                      02/09/23              EXERCISE LOG  Exercise Repetitions and Resistance Comments  Nustep Lvl 3 x 20 mins   Rockerboard 4 mins   LAQs 2# x 30 reps bil    Seated Marches 2# x 30 reps bil   Seated Hip Abduction Red x 3 mins   Seated Hip Adduction    Seated Ham Curls Red x 30 reps bil   STS     Blank cell = exercise not performed today   Modalities  Date:  Unattended Estim: Lumbar, IFC 80-150 Hz, 15 mins, Pain Hot Pack: Lumbar, 15 mins, Pain and Tone  01/29/23:  Patient in [place] position with folded pillow between knees for comfort:   Combo e'stim/US at 1.50 w/cm2 X 12 minutes F/B  stw/m X 11 to patient's left SIJ/proximal gluteal f/b HMP and IFC at 80-150 Hz on 40% scan x 20 minutes.  Normal modality response following removal of modality.   PATIENT EDUCATION:  Education details:  Person educated:  International aid/development worker:  Education comprehension:   HOME EXERCISE PROGRAM:   ASSESSMENT:  CLINICAL IMPRESSION: Pt arrives for today's treatment session reporting 1/10 left low back pain.  Pt introduced to standing rockerboard today with good results.  Pt requiring BUE support for safety and stability.  Pt able to tolerate increased reps with  various exercises today without any complaint or increase in pain.  Normal responses to estim and MH noted upon removal.  Pt denied any pain at completion of today's treatment session.   OBJECTIVE IMPAIRMENTS: decreased activity tolerance, increased muscle spasms, and pain.   ACTIVITY LIMITATIONS: carrying, lifting, bending, and standing  PARTICIPATION LIMITATIONS: meal prep, cleaning, and laundry  PERSONAL FACTORS: 1 comorbidity: h/o DDD  are also affecting patient's functional outcome.   REHAB POTENTIAL: Good  CLINICAL DECISION MAKING: Evolving/moderate complexity  EVALUATION COMPLEXITY: Low   GOALS:  LONG TERM GOALS: Target date: 03/09/23  Ind with a HEP.  Goal status: INITIAL  2.  Eliminate left LE pain.  Goal status: INITIAL  3.  Perform ADL's with pain not > 3/10.  Goal status: INITIAL  PLAN:  PT FREQUENCY: 2x/week  PT DURATION: 6 weeks  PLANNED INTERVENTIONS: 97110-Therapeutic exercises, 97530- Therapeutic activity, [id number]- Neuromuscular re-education, 97535- Self Care, 65784- Manual therapy, 97014- Electrical stimulation (unattended), 97035- Ultrasound, 69629- Traction (mechanical), Patient/Family education, Dry Needling, Cryotherapy, and Moist heat.  PLAN FOR NEXT SESSION: Combo e'stim/US,  STW/M, Core exercise progression, spinal protection   [name], PTA 02/09/2023, 4:54 PM

## 2023-02-16 ENCOUNTER — Ambulatory Visit: Payer: 59 | Attending: Family Medicine | Admitting: Physical Therapy

## 2023-02-16 DIAGNOSIS — M5459 Other low back pain: Secondary | ICD-10-CM | POA: Diagnosis present

## 2023-02-16 DIAGNOSIS — M62830 Muscle spasm of back: Secondary | ICD-10-CM | POA: Insufficient documentation

## 2023-02-16 NOTE — Therapy (Signed)
 OUTPATIENT PHYSICAL THERAPY THORACOLUMBAR EVALUATION   Patient Name: Angela Newman MRN: [id number] DOB:[birth year], 48 y.o., female Today's Date: 02/16/2023  END OF SESSION:  PT End of Session - 02/16/23 1547     Visit Number 5    Number of Visits 12    Date for PT Re-Evaluation 03/09/23    PT Start Time 0343    PT Stop Time 0433    PT Time Calculation (min) 50 min    Activity Tolerance Patient tolerated treatment well    Behavior During Therapy [facility] for tasks assessed/performed              Past Medical History:  Diagnosis Date   Anxiety    Depression    Fatigue    Graves disease    Hyperlipidemia    Hypertension    Hypothyroid    Snoring    No past surgical history on file. Patient Active Problem List   Diagnosis Date Noted   Chronic bilateral low back pain without sciatica 06/09/2022   Morbid obesity (HCC) 03/10/2022   GAD (generalized anxiety disorder) 03/10/2022   Hypertension    Hypothyroid    Anxiety    Depression, recurrent (HCC)    Snoring    Hyperlipidemia     REFERRING PROVIDER: Rosina Bullock PA.    REFERRING DIAG: Lumbar pain.  Rationale for Evaluation and Treatment: Rehabilitation  THERAPY DIAG:  Other low back pain  Muscle spasm of back  ONSET DATE: November 2024.   SUBJECTIVE:                                                                                                                                                                                           SUBJECTIVE STATEMENT: Woke up with pain around a 7.  Think its related to lying on back.  PERTINENT HISTORY:  Per patient she has a h/O lumbar DDD.  Please see above.  PAIN:  Are you having pain? Yes: NPRS scale:  /10 Pain location: Left low back and radiation into posterior left LE to ankle. Pain description: Sharp Aggravating factors: As above. Relieving factors: As above.    PRECAUTIONS: None  RED FLAGS: None   WEIGHT BEARING RESTRICTIONS: Yes .  FALLS:  Has  patient fallen in last 6 months? No  LIVING ENVIRONMENT: Lives with: lives with their spouse Lives in: House/apartment Has following equipment at home: None  PLOF: Independent  PATIENT GOALS: Not have this pain.    OBJECTIVE:   POSTURE:  The patient's posture is generally quite good.  PALPATION: Tender over left SIJ region and upper glut.  LUMBAR ROM:   Full active  lumbar flexion and extension.    LOWER EXTREMITY ROM:     WNL.  LOWER EXTREMITY MMT:   Normal LE strength.  LUMBAR SPECIAL TESTS:  Equal leg lengths. Pain reproduction with a left SLR test.  (-) FABER test.  LE DTR's are normal.  GAIT: Essentially normal.    TODAY'S TREATMENT:                                                                                                                              DATE:   02/16/23:  Nustep level 4 x 20 minutes f/b left femoral traction with oscillations x 5 minutes f/b HMP and IFC at 80-150 Hz on 40% scan x 20 minutes to patient's left low back/SIJ region.  Normal modality response following removal of modality.                       02/09/23              EXERCISE LOG  Exercise Repetitions and Resistance Comments  Nustep Lvl 3 x 20 mins   Rockerboard 4 mins   LAQs 2# x 30 reps bil    Seated Marches 2# x 30 reps bil   Seated Hip Abduction Red x 3 mins   Seated Hip Adduction    Seated Ham Curls Red x 30 reps bil   STS     Blank cell = exercise not performed today   Modalities  Date:  Unattended Estim: Lumbar, IFC 80-150 Hz, 15 mins, Pain Hot Pack: Lumbar, 15 mins, Pain and Tone  01/29/23:  Patient in [facility] position with folded pillow between knees for comfort:   Combo e'stim/US  at 1.50 w/cm2 X 12 minutes F/B  stw/m X 11 to patient's left SIJ/proximal gluteal f/b HMP and IFC at 80-150 Hz on 40% scan x 20 minutes.  Normal modality response following removal of modality.   PATIENT EDUCATION:  Education details:  Person educated:  International aid/development worker:  Education  comprehension:   HOME EXERCISE PROGRAM:   ASSESSMENT:  CLINICAL IMPRESSION: Patient woke up with increased pain which she feels is related to ending up on her back during her sleep.  She tries to sleep in the sdly position with a pillow between her knees.  Patient reported no pain after treatment.    OBJECTIVE IMPAIRMENTS: decreased activity tolerance, increased muscle spasms, and pain.   ACTIVITY LIMITATIONS: carrying, lifting, bending, and standing  PARTICIPATION LIMITATIONS: meal prep, cleaning, and laundry  PERSONAL FACTORS: 1 comorbidity: h/o DDD  are also affecting patient's functional outcome.   REHAB POTENTIAL: Good  CLINICAL DECISION MAKING: Evolving/moderate complexity  EVALUATION COMPLEXITY: Low   GOALS:  LONG TERM GOALS: Target date: 03/09/23  Ind with a HEP.  Goal status: INITIAL  2.  Eliminate left LE pain.  Goal status: INITIAL  3.  Perform ADL's with pain not > 3/10.  Goal status: INITIAL  PLAN:  PT FREQUENCY: 2x/week  PT DURATION: 6 weeks  PLANNED INTERVENTIONS: 97110-Therapeutic exercises, 97530- Therapeutic activity, [id number]- Neuromuscular re-education, 97535- Self Care, 02859- Manual therapy, 97014- Electrical stimulation (unattended), [id number]- Ultrasound, 02987- Traction (mechanical), Patient/Family education, Dry Needling, Cryotherapy, and Moist heat.  PLAN FOR NEXT SESSION: Combo e'stim/US , STW/M, Core exercise progression, spinal protection   Tashima Scarpulla, PT 02/16/2023, 4:41 PM

## 2023-02-19 ENCOUNTER — Other Ambulatory Visit: Payer: Self-pay | Admitting: Family Medicine

## 2023-02-19 ENCOUNTER — Ambulatory Visit: Payer: 59

## 2023-02-19 DIAGNOSIS — M62830 Muscle spasm of back: Secondary | ICD-10-CM

## 2023-02-19 DIAGNOSIS — M5459 Other low back pain: Secondary | ICD-10-CM | POA: Diagnosis not present

## 2023-02-19 DIAGNOSIS — E039 Hypothyroidism, unspecified: Secondary | ICD-10-CM

## 2023-02-19 NOTE — Therapy (Signed)
 OUTPATIENT PHYSICAL THERAPY THORACOLUMBAR TREATMENT    Patient Name: Angela Newman MRN: [id number] DOB:[birth year], 48 y.o., female Today's Date: 02/19/2023  END OF SESSION:  PT End of Session - 02/19/23 1618     Visit Number 6    Number of Visits 12    Date for PT Re-Evaluation 03/09/23    PT Start Time 1600    PT Stop Time 1642    PT Time Calculation (min) 42 min    Activity Tolerance Patient tolerated treatment well    Behavior During Therapy WFL for tasks assessed/performed              Past Medical History:  Diagnosis Date   Anxiety    Depression    Fatigue    Graves disease    Hyperlipidemia    Hypertension    Hypothyroid    Snoring    History reviewed. No pertinent surgical history. Patient Active Problem List   Diagnosis Date Noted   Chronic bilateral low back pain without sciatica 06/09/2022   Morbid obesity (HCC) 03/10/2022   GAD (generalized anxiety disorder) 03/10/2022   Hypertension    Hypothyroid    Anxiety    Depression, recurrent (HCC)    Snoring    Hyperlipidemia     REFERRING PROVIDER: Rosina Bullock PA.    REFERRING DIAG: Lumbar pain.  Rationale for Evaluation and Treatment: Rehabilitation  THERAPY DIAG:  Other low back pain  Muscle spasm of back  ONSET DATE: November 2024.   SUBJECTIVE:                                                                                                                                                                                           SUBJECTIVE STATEMENT: Pt reports 2/10 low back pain today.   PERTINENT HISTORY:  Per patient she has a h/O lumbar DDD.  Please see above.  PAIN:  Are you having pain? Yes: NPRS scale: 2/10 Pain location: Left low back and radiation into posterior left LE to ankle. Pain description: Sharp Aggravating factors: As above. Relieving factors: As above.    PRECAUTIONS: None  RED FLAGS: None   WEIGHT BEARING RESTRICTIONS: Yes .  FALLS:  Has patient fallen  in last 6 months? No  LIVING ENVIRONMENT: Lives with: lives with their spouse Lives in: House/apartment Has following equipment at home: None  PLOF: Independent  PATIENT GOALS: Not have this pain.    OBJECTIVE:   POSTURE:  The patient's posture is generally quite good.  PALPATION: Tender over left SIJ region and upper glut.  LUMBAR ROM:   Full active lumbar flexion and extension.  LOWER EXTREMITY ROM:     WNL.  LOWER EXTREMITY MMT:   Normal LE strength.  LUMBAR SPECIAL TESTS:  Equal leg lengths. Pain reproduction with a left SLR test.  (-) FABER test.  LE DTR's are normal.  GAIT: Essentially normal.    TODAY'S TREATMENT:                                                                                                                              DATE:    02/19/23   EXERCISE LOG  Exercise Repetitions and Resistance Comments  Nustep Lvl 3 x 20 mins   Rockerboard 4 mins   LAQs 3# x 25 reps bil    Seated Marches 3# x 25 reps bil   Seated Hip Abduction Green x 3 mins   Seated Hip Adduction 3 mins   Seated Ham Curls Green x 25 reps bil   STS 10 reps    Blank cell = exercise not performed today    02/16/23:  Nustep level 4 x 20 minutes f/b left femoral traction with oscillations x 5 minutes f/b HMP and IFC at 80-150 Hz on 40% scan x 20 minutes to patient's left low back/SIJ region.  Normal modality response following removal of modality.                       02/09/23              EXERCISE LOG  Exercise Repetitions and Resistance Comments  Nustep Lvl 3 x 20 mins   Rockerboard 4 mins   LAQs 2# x 30 reps bil    Seated Marches 2# x 30 reps bil   Seated Hip Abduction Red x 3 mins   Seated Hip Adduction    Seated Ham Curls Red x 30 reps bil   STS     Blank cell = exercise not performed today   Modalities  Date:  Unattended Estim: Lumbar, IFC 80-150 Hz, 15 mins, Pain Hot Pack: Lumbar, 15 mins, Pain and Tone  01/29/23:  Patient in [facility] position with folded  pillow between knees for comfort:   Combo e'stim/US  at 1.50 w/cm2 X 12 minutes F/B  stw/m X 11 to patient's left SIJ/proximal gluteal f/b HMP and IFC at 80-150 Hz on 40% scan x 20 minutes.  Normal modality response following removal of modality.   PATIENT EDUCATION:  Education details:  Person educated:  International aid/development worker:  Education comprehension:   HOME EXERCISE PROGRAM:   ASSESSMENT:  CLINICAL IMPRESSION: Pt arrives for today's treatment session reporting 2/10 low back pain.  Pt able to tolerate increased resistance with all seated exercises today.  Pt instructed to perform SKTC and LTR in the mornings prior to getting out of bed.  Pt opted to not perform estim today as an attempt to see how her back reacts without its.  Pt denied any pain at completion of today's treatment session.   OBJECTIVE  IMPAIRMENTS: decreased activity tolerance, increased muscle spasms, and pain.   ACTIVITY LIMITATIONS: carrying, lifting, bending, and standing  PARTICIPATION LIMITATIONS: meal prep, cleaning, and laundry  PERSONAL FACTORS: 1 comorbidity: h/o DDD  are also affecting patient's functional outcome.   REHAB POTENTIAL: Good  CLINICAL DECISION MAKING: Evolving/moderate complexity  EVALUATION COMPLEXITY: Low   GOALS:  LONG TERM GOALS: Target date: 03/09/23  Ind with a HEP.  Goal status: INITIAL  2.  Eliminate left LE pain.  Goal status: INITIAL  3.  Perform ADL's with pain not > 3/10.  Goal status: INITIAL  PLAN:  PT FREQUENCY: 2x/week  PT DURATION: 6 weeks  PLANNED INTERVENTIONS: 97110-Therapeutic exercises, 97530- Therapeutic activity, [id number]- Neuromuscular re-education, 97535- Self Care, 02859- Manual therapy, 97014- Electrical stimulation (unattended), 97035- Ultrasound, 02987- Traction (mechanical), Patient/Family education, Dry Needling, Cryotherapy, and Moist heat.  PLAN FOR NEXT SESSION: Combo e'stim/US , STW/M, Core exercise progression, spinal protection   Delon DELENA Gosling, PTA 02/19/2023, 5:05 PM

## 2023-02-21 ENCOUNTER — Other Ambulatory Visit: Payer: Self-pay | Admitting: Family Medicine

## 2023-02-21 DIAGNOSIS — E039 Hypothyroidism, unspecified: Secondary | ICD-10-CM

## 2023-02-24 ENCOUNTER — Ambulatory Visit: Payer: 59 | Admitting: Physical Therapy

## 2023-02-24 DIAGNOSIS — M5459 Other low back pain: Secondary | ICD-10-CM

## 2023-02-24 DIAGNOSIS — M62830 Muscle spasm of back: Secondary | ICD-10-CM

## 2023-02-24 NOTE — Therapy (Signed)
 OUTPATIENT PHYSICAL THERAPY THORACOLUMBAR TREATMENT    Patient Name: Angela Newman MRN: [id number] DOB:[birth year], 48 y.o., female Today's Date: 02/24/2023  END OF SESSION:  PT End of Session - 02/24/23 1606     Visit Number 7    Number of Visits 12    Date for PT Re-Evaluation 03/09/23    PT Start Time 1600    PT Stop Time 1654    PT Time Calculation (min) 54 min    Activity Tolerance Patient tolerated treatment well    Behavior During Therapy WFL for tasks assessed/performed              Past Medical History:  Diagnosis Date   Anxiety    Depression    Fatigue    Graves disease    Hyperlipidemia    Hypertension    Hypothyroid    Snoring    History reviewed. No pertinent surgical history. Patient Active Problem List   Diagnosis Date Noted   Chronic bilateral low back pain without sciatica 06/09/2022   Morbid obesity (HCC) 03/10/2022   GAD (generalized anxiety disorder) 03/10/2022   Hypertension    Hypothyroid    Anxiety    Depression, recurrent (HCC)    Snoring    Hyperlipidemia     REFERRING PROVIDER: Rosina Bullock PA.    REFERRING DIAG: Lumbar pain.  Rationale for Evaluation and Treatment: Rehabilitation  THERAPY DIAG:  Other low back pain  Muscle spasm of back  ONSET DATE: November 2024.   SUBJECTIVE:                                                                                                                                                                                           SUBJECTIVE STATEMENT: Pt denies any pain today.   PERTINENT HISTORY:  Per patient she has a h/O lumbar DDD.  Please see above.  PAIN:  Are you having pain? Yes: NPRS scale: 2/10 Pain location: Left low back and radiation into posterior left LE to ankle. Pain description: Sharp Aggravating factors: As above. Relieving factors: As above.    PRECAUTIONS: None  RED FLAGS: None   WEIGHT BEARING RESTRICTIONS: Yes .  FALLS:  Has patient fallen in last 6  months? No  LIVING ENVIRONMENT: Lives with: lives with their spouse Lives in: House/apartment Has following equipment at home: None  PLOF: Independent  PATIENT GOALS: Not have this pain.    OBJECTIVE:   POSTURE:  The patient's posture is generally quite good.  PALPATION: Tender over left SIJ region and upper glut.  LUMBAR ROM:   Full active lumbar flexion and extension.    LOWER EXTREMITY  ROM:     WNL.  LOWER EXTREMITY MMT:   Normal LE strength.  LUMBAR SPECIAL TESTS:  Equal leg lengths. Pain reproduction with a left SLR test.  (-) FABER test.  LE DTR's are normal.  GAIT: Essentially normal.    TODAY'S TREATMENT:                                                                                                                              DATE:   02/24/23                                    EXERCISE LOG  Exercise Repetitions and Resistance Comments  Nustep  Lvl 4 x 20 mins   Rockerboard 4 mins   Forward Step Ups 6 x 20 reps    Standing Marches 2 mins   Standing Hip Abduction 20 reps bil   LAQs     Blank cell = exercise not performed today   Modalities  Date:  Unattended Estim: Lumbar, IFC 80-150 hz, 15 mins, Pain Hot Pack: Lumbar, 15 mins, Pain   02/19/23   EXERCISE LOG  Exercise Repetitions and Resistance Comments  Nustep Lvl 3 x 20 mins   Rockerboard 4 mins   LAQs 3# x 25 reps bil    Seated Marches 3# x 25 reps bil   Seated Hip Abduction Green x 3 mins   Seated Hip Adduction 3 mins   Seated Ham Curls Green x 25 reps bil   STS 10 reps    Blank cell = exercise not performed today    02/16/23:  Nustep level 4 x 20 minutes f/b left femoral traction with oscillations x 5 minutes f/b HMP and IFC at 80-150 Hz on 40% scan x 20 minutes to patient's left low back/SIJ region.  Normal modality response following removal of modality.                       02/09/23              EXERCISE LOG  Exercise Repetitions and Resistance Comments  Nustep Lvl 3 x 20 mins    Rockerboard 4 mins   LAQs 2# x 30 reps bil    Seated Marches 2# x 30 reps bil   Seated Hip Abduction Red x 3 mins   Seated Hip Adduction    Seated Ham Curls Red x 30 reps bil   STS     Blank cell = exercise not performed today   Modalities  Date:  Unattended Estim: Lumbar, IFC 80-150 Hz, 15 mins, Pain Hot Pack: Lumbar, 15 mins, Pain and Tone  01/29/23:  Patient in [facility] position with folded pillow between knees for comfort:   Combo e'stim/US  at 1.50 w/cm2 X 12 minutes F/B  stw/m X 11 to patient's left SIJ/proximal gluteal f/b HMP and IFC at 80-150  Hz on 40% scan x 20 minutes.  Normal modality response following removal of modality.   PATIENT EDUCATION:  Education details:  Person educated:  International aid/development worker:  Education comprehension:   HOME EXERCISE PROGRAM:   ASSESSMENT:  CLINICAL IMPRESSION: Pt arrives for today's treatment session denying any pain.  Pt able to tolerate increased resistance on Nustep today with minimal fatigue.  Pt instructed in several standing exercises today to increase strength and function.  Pt requiring min cues for proper technique and posture with newly added exercises.  Normal responses to estim and Mh noted upon removal.  Pt denied any pain at completion of today's treatment session.  OBJECTIVE IMPAIRMENTS: decreased activity tolerance, increased muscle spasms, and pain.   ACTIVITY LIMITATIONS: carrying, lifting, bending, and standing  PARTICIPATION LIMITATIONS: meal prep, cleaning, and laundry  PERSONAL FACTORS: 1 comorbidity: h/o DDD  are also affecting patient's functional outcome.   REHAB POTENTIAL: Good  CLINICAL DECISION MAKING: Evolving/moderate complexity  EVALUATION COMPLEXITY: Low   GOALS:  LONG TERM GOALS: Target date: 03/09/23  Ind with a HEP.  Goal status: IN PROGRESS  2.  Eliminate left LE pain.  Goal status: MET  3.  Perform ADL's with pain not > 3/10.   1/14: mostly days Goal status: PARTIALLY  MET  PLAN:  PT FREQUENCY: 2x/week  PT DURATION: 6 weeks  PLANNED INTERVENTIONS: 97110-Therapeutic exercises, 97530- Therapeutic activity, 97112- Neuromuscular re-education, 97535- Self Care, 02859- Manual therapy, 97014- Electrical stimulation (unattended), 97035- Ultrasound, 02987- Traction (mechanical), Patient/Family education, Dry Needling, Cryotherapy, and Moist heat.  PLAN FOR NEXT SESSION: Combo e'stim/US , STW/M, Core exercise progression, spinal protection   Delon DELENA Gosling, PTA 02/24/2023, 4:54 PM

## 2023-02-26 ENCOUNTER — Ambulatory Visit: Payer: 59

## 2023-02-26 DIAGNOSIS — M5459 Other low back pain: Secondary | ICD-10-CM

## 2023-02-26 DIAGNOSIS — M62830 Muscle spasm of back: Secondary | ICD-10-CM

## 2023-02-26 NOTE — Therapy (Signed)
OUTPATIENT PHYSICAL THERAPY THORACOLUMBAR TREATMENT    Patient Name: Angela Newman MRN: [id number] DOB:[birth year], 48 y.o., female Today's Date: 02/26/2023  END OF SESSION:  PT End of Session - 02/26/23 1610     Visit Number 8    Number of Visits 12    Date for PT Re-Evaluation 03/09/23    PT Start Time 1600    PT Stop Time 1659    PT Time Calculation (min) 59 min    Activity Tolerance Patient tolerated treatment well    Behavior During Therapy WFL for tasks assessed/performed              Past Medical History:  Diagnosis Date   Anxiety    Depression    Fatigue    Graves disease    Hyperlipidemia    Hypertension    Hypothyroid    Snoring    No past surgical history on file. Patient Active Problem List   Diagnosis Date Noted   Chronic bilateral low back pain without sciatica 06/09/2022   Morbid obesity (HCC) 03/10/2022   GAD (generalized anxiety disorder) 03/10/2022   Hypertension    Hypothyroid    Anxiety    Depression, recurrent (HCC)    Snoring    Hyperlipidemia     REFERRING PROVIDER: Betsey Holiday PA.    REFERRING DIAG: Lumbar pain.  Rationale for Evaluation and Treatment: Rehabilitation  THERAPY DIAG:  Other low back pain  Muscle spasm of back  ONSET DATE: November 2024.   SUBJECTIVE:                                                                                                                                                                                           SUBJECTIVE STATEMENT: Pt denies any pain today.   PERTINENT HISTORY:  Per patient she has a h/O lumbar DDD.  Please see above.  PAIN:  Are you having pain? Yes: NPRS scale: 2/10 Pain location: Left low back and radiation into posterior left LE to ankle. Pain description: Sharp Aggravating factors: As above. Relieving factors: As above.    PRECAUTIONS: None  RED FLAGS: None   WEIGHT BEARING RESTRICTIONS: Yes .  FALLS:  Has patient fallen in last 6 months?  No  LIVING ENVIRONMENT: Lives with: lives with their spouse Lives in: House/apartment Has following equipment at home: None  PLOF: Independent  PATIENT GOALS: Not have this pain.    OBJECTIVE:   POSTURE:  The patient's posture is generally quite good.  PALPATION: Tender over left SIJ region and upper glut.  LUMBAR ROM:   Full active lumbar flexion and extension.    LOWER EXTREMITY  ROM:     WNL.  LOWER EXTREMITY MMT:   Normal LE strength.  LUMBAR SPECIAL TESTS:  Equal leg lengths. Pain reproduction with a left SLR test.  (-) FABER test.  LE DTR's are normal.  GAIT: Essentially normal.    TODAY'S TREATMENT:                                                                                                                              DATE:   02/24/23                                    EXERCISE LOG  Exercise Repetitions and Resistance Comments  Nustep  Lvl 4 x 20 mins   Rockerboard 5 mins   Forward Step Ups 6" x 20 reps    Standing Marches Airex 2 mins   Standing Hip Abduction 25 reps bil   LAQs     Blank cell = exercise not performed today   Modalities  Date:  Unattended Estim: Lumbar, IFC 80-150 hz, 15 mins, Pain Hot Pack: Lumbar, 15 mins, Pain   02/19/23   EXERCISE LOG  Exercise Repetitions and Resistance Comments  Nustep Lvl 3 x 20 mins   Rockerboard 4 mins   LAQs 3# x 25 reps bil    Seated Marches 3# x 25 reps bil   Seated Hip Abduction Green x 3 mins   Seated Hip Adduction 3 mins   Seated Ham Curls Green x 25 reps bil   STS 10 reps    Blank cell = exercise not performed today    02/16/23:  Nustep level 4 x 20 minutes f/b left femoral traction with oscillations x 5 minutes f/b HMP and IFC at 80-150 Hz on 40% scan x 20 minutes to patient's left low back/SIJ region.  Normal modality response following removal of modality.                       02/09/23              EXERCISE LOG  Exercise Repetitions and Resistance Comments  Nustep Lvl 3 x 20 mins    Rockerboard 4 mins   LAQs 2# x 30 reps bil    Seated Marches 2# x 30 reps bil   Seated Hip Abduction Red x 3 mins   Seated Hip Adduction    Seated Ham Curls Red x 30 reps bil   STS     Blank cell = exercise not performed today   Modalities  Date:  Unattended Estim: Lumbar, IFC 80-150 Hz, 15 mins, Pain Hot Pack: Lumbar, 15 mins, Pain and Tone  PATIENT EDUCATION:  Education details:  Person educated:  International aid/development worker:  Education comprehension:   HOME EXERCISE PROGRAM:   ASSESSMENT:  CLINICAL IMPRESSION: Pt arrives for today's treatment session denying any pain.   Pt able  to tolerate increased time or reps with all exercises performed today.  Pt tolerated addition of Airex with standing marches today, with pt requiring use of BUE for safety and support.  Normal responses to estim and MH noted upon removal.  Pt denied any pain at completion of today's treatment session.  OBJECTIVE IMPAIRMENTS: decreased activity tolerance, increased muscle spasms, and pain.   ACTIVITY LIMITATIONS: carrying, lifting, bending, and standing  PARTICIPATION LIMITATIONS: meal prep, cleaning, and laundry  PERSONAL FACTORS: 1 comorbidity: h/o DDD  are also affecting patient's functional outcome.   REHAB POTENTIAL: Good  CLINICAL DECISION MAKING: Evolving/moderate complexity  EVALUATION COMPLEXITY: Low   GOALS:  LONG TERM GOALS: Target date: 03/09/23  Ind with a HEP.  Goal status: IN PROGRESS  2.  Eliminate left LE pain.  Goal status: MET  3.  Perform ADL's with pain not > 3/10.   1/14: "mostly days" Goal status: PARTIALLY MET  PLAN:  PT FREQUENCY: 2x/week  PT DURATION: 6 weeks  PLANNED INTERVENTIONS: 97110-Therapeutic exercises, 97530- Therapeutic activity, 97112- Neuromuscular re-education, 97535- Self Care, 40347- Manual therapy, 97014- Electrical stimulation (unattended), 97035- Ultrasound, 42595- Traction (mechanical), Patient/Family education, Dry Needling, Cryotherapy, and  Moist heat.  PLAN FOR NEXT SESSION: Combo e'stim/US, STW/M, Core exercise progression, spinal protection   [name], PTA 02/26/2023, 5:00 PM

## 2023-03-05 ENCOUNTER — Ambulatory Visit: Payer: 59

## 2023-03-05 DIAGNOSIS — M5459 Other low back pain: Secondary | ICD-10-CM | POA: Diagnosis not present

## 2023-03-05 DIAGNOSIS — M62830 Muscle spasm of back: Secondary | ICD-10-CM

## 2023-03-05 NOTE — Therapy (Signed)
OUTPATIENT PHYSICAL THERAPY THORACOLUMBAR TREATMENT    Patient Name: Angela Newman MRN: [id number] DOB:1976-02-02, 48 y.o., female Today's Date: 03/05/2023  END OF SESSION:  PT End of Session - 03/05/23 1615     Visit Number 9    Number of Visits 12    Date for PT Re-Evaluation 03/09/23    PT Start Time 1600    PT Stop Time 1641    PT Time Calculation (min) 41 min    Activity Tolerance Patient tolerated treatment well    Behavior During Therapy [facility] for tasks assessed/performed              Past Medical History:  Diagnosis Date   Anxiety    Depression    Fatigue    Graves disease    Hyperlipidemia    Hypertension    Hypothyroid    Snoring    History reviewed. No pertinent surgical history. Patient Active Problem List   Diagnosis Date Noted   Chronic bilateral low back pain without sciatica 06/09/2022   Morbid obesity (HCC) 03/10/2022   GAD (generalized anxiety disorder) 03/10/2022   Hypertension    Hypothyroid    Anxiety    Depression, recurrent (HCC)    Snoring    Hyperlipidemia     REFERRING PROVIDER: Betsey Holiday PA.    REFERRING DIAG: Lumbar pain.  Rationale for Evaluation and Treatment: Rehabilitation  THERAPY DIAG:  Other low back pain  Muscle spasm of back  ONSET DATE: November 2024.   SUBJECTIVE:                                                                                                                                                                                           SUBJECTIVE STATEMENT: Pt denies any pain today.   PERTINENT HISTORY:  Per patient she has a h/O lumbar DDD.  Please see above.  PAIN:  Are you having pain? Yes: NPRS scale: 2/10 Pain location: Left low back and radiation into posterior left LE to ankle. Pain description: Sharp Aggravating factors: As above. Relieving factors: As above.    PRECAUTIONS: None  RED FLAGS: None   WEIGHT BEARING RESTRICTIONS: Yes .  FALLS:  Has patient fallen in last 6  months? No  LIVING ENVIRONMENT: Lives with: lives with their spouse Lives in: House/apartment Has following equipment at home: None  PLOF: Independent  PATIENT GOALS: Not have this pain.    OBJECTIVE:   POSTURE:  The patient's posture is generally quite good.  PALPATION: Tender over left SIJ region and upper glut.  LUMBAR ROM:   Full active lumbar flexion and extension.    LOWER EXTREMITY  ROM:     WNL.  LOWER EXTREMITY MMT:   Normal LE strength.  LUMBAR SPECIAL TESTS:  Equal leg lengths. Pain reproduction with a left SLR test.  (-) FABER test.  LE DTR's are normal.  GAIT: Essentially normal.    TODAY'S TREATMENT:                                                                                                                              DATE:   02/24/23                                    EXERCISE LOG  Exercise Repetitions and Resistance Comments  Nustep  Lvl 4 x 20 mins   Rockerboard 5 mins   Forward Step Ups 6" x 20 reps    Standing Marches Airex 4 mins   Standing Hip Abduction 2# 25 reps bil   Standing Ham Curls 2# x 25 reps bil    Blank cell = exercise not performed today   Modalities  Date:  Unattended Estim: Lumbar, IFC 80-150 hz, 15 mins, Pain Hot Pack: Lumbar, 15 mins, Pain   02/19/23   EXERCISE LOG  Exercise Repetitions and Resistance Comments  Nustep Lvl 3 x 20 mins   Rockerboard 4 mins   LAQs 3# x 25 reps bil    Seated Marches 3# x 25 reps bil   Seated Hip Abduction Green x 3 mins   Seated Hip Adduction 3 mins   Seated Ham Curls Green x 25 reps bil   STS 10 reps    Blank cell = exercise not performed today    02/16/23:  Nustep level 4 x 20 minutes f/b left femoral traction with oscillations x 5 minutes f/b HMP and IFC at 80-150 Hz on 40% scan x 20 minutes to patient's left low back/SIJ region.  Normal modality response following removal of modality.                       02/09/23              EXERCISE LOG  Exercise Repetitions and  Resistance Comments  Nustep Lvl 3 x 20 mins   Rockerboard 4 mins   LAQs 2# x 30 reps bil    Seated Marches 2# x 30 reps bil   Seated Hip Abduction Red x 3 mins   Seated Hip Adduction    Seated Ham Curls Red x 30 reps bil   STS     Blank cell = exercise not performed today   Modalities  Date:  Unattended Estim: Lumbar, IFC 80-150 Hz, 15 mins, Pain Hot Pack: Lumbar, 15 mins, Pain and Tone  PATIENT EDUCATION:  Education details:  Person educated:  International aid/development worker:  Education comprehension:   HOME EXERCISE PROGRAM:   ASSESSMENT:  CLINICAL IMPRESSION: Pt arrives for today's treatment session  denying any pain.  Pt reports that her back has been feeling pretty good lately.  Pt able to tolerate increased time or reps with all previously performed exercises.  Pt introduced to standing ham curls with good results. Pt denied any pain at completion of today's treatment session.  OBJECTIVE IMPAIRMENTS: decreased activity tolerance, increased muscle spasms, and pain.   ACTIVITY LIMITATIONS: carrying, lifting, bending, and standing  PARTICIPATION LIMITATIONS: meal prep, cleaning, and laundry  PERSONAL FACTORS: 1 comorbidity: h/o DDD  are also affecting patient's functional outcome.   REHAB POTENTIAL: Good  CLINICAL DECISION MAKING: Evolving/moderate complexity  EVALUATION COMPLEXITY: Low   GOALS:  LONG TERM GOALS: Target date: 03/09/23  Ind with a HEP.  Goal status: IN PROGRESS  2.  Eliminate left LE pain.  Goal status: MET  3.  Perform ADL's with pain not > 3/10.   1/14: "mostly days" Goal status: PARTIALLY MET  PLAN:  PT FREQUENCY: 2x/week  PT DURATION: 6 weeks  PLANNED INTERVENTIONS: 97110-Therapeutic exercises, 97530- Therapeutic activity, 97112- Neuromuscular re-education, 97535- Self Care, 16109- Manual therapy, 97014- Electrical stimulation (unattended), 97035- Ultrasound, 60454- Traction (mechanical), Patient/Family education, Dry Needling, Cryotherapy, and  Moist heat.  PLAN FOR NEXT SESSION: Combo e'stim/US, STW/M, Core exercise progression, spinal protection   [name], PTA 03/05/2023, 4:44 PM

## 2023-03-10 ENCOUNTER — Ambulatory Visit: Payer: 59

## 2023-03-10 DIAGNOSIS — M5459 Other low back pain: Secondary | ICD-10-CM | POA: Diagnosis not present

## 2023-03-10 DIAGNOSIS — M62830 Muscle spasm of back: Secondary | ICD-10-CM

## 2023-03-10 NOTE — Therapy (Signed)
OUTPATIENT PHYSICAL THERAPY THORACOLUMBAR TREATMENT    Patient Name: Angela Newman MRN: [id number] DOB:[birth year], 48 y.o., female Today's Date: 03/10/2023  END OF SESSION:  PT End of Session - 03/10/23 1603     Visit Number 10    Number of Visits 12    Date for PT Re-Evaluation 03/09/23    PT Start Time 1600    PT Stop Time 1642    PT Time Calculation (min) 42 min    Activity Tolerance Patient tolerated treatment well    Behavior During Therapy WFL for tasks assessed/performed              Past Medical History:  Diagnosis Date   Anxiety    Depression    Fatigue    Graves disease    Hyperlipidemia    Hypertension    Hypothyroid    Snoring    History reviewed. No pertinent surgical history. Patient Active Problem List   Diagnosis Date Noted   Chronic bilateral low back pain without sciatica 06/09/2022   Morbid obesity (HCC) 03/10/2022   GAD (generalized anxiety disorder) 03/10/2022   Hypertension    Hypothyroid    Anxiety    Depression, recurrent (HCC)    Snoring    Hyperlipidemia     REFERRING PROVIDER: Betsey Holiday PA.    REFERRING DIAG: Lumbar pain.  Rationale for Evaluation and Treatment: Rehabilitation  THERAPY DIAG:  Other low back pain  Muscle spasm of back  ONSET DATE: November 2024.   SUBJECTIVE:                                                                                                                                                                                           SUBJECTIVE STATEMENT: Pt denies any pain today.   PERTINENT HISTORY:  Per patient she has a h/O lumbar DDD.  Please see above.  PAIN:  Are you having pain? Yes: NPRS scale: 2/10 Pain location: Left low back and radiation into posterior left LE to ankle. Pain description: Sharp Aggravating factors: As above. Relieving factors: As above.    PRECAUTIONS: None  RED FLAGS: None   WEIGHT BEARING RESTRICTIONS: Yes .  FALLS:  Has patient fallen in last  6 months? No  LIVING ENVIRONMENT: Lives with: lives with their spouse Lives in: House/apartment Has following equipment at home: None  PLOF: Independent  PATIENT GOALS: Not have this pain.    OBJECTIVE:   POSTURE:  The patient's posture is generally quite good.  PALPATION: Tender over left SIJ region and upper glut.  LUMBAR ROM:   Full active lumbar flexion and extension.    LOWER EXTREMITY  ROM:     WNL.  LOWER EXTREMITY MMT:   Normal LE strength.  LUMBAR SPECIAL TESTS:  Equal leg lengths. Pain reproduction with a left SLR test.  (-) FABER test.  LE DTR's are normal.  GAIT: Essentially normal.    TODAY'S TREATMENT:                                                                                                                              DATE:   03/10/23                                    EXERCISE LOG  Exercise Repetitions and Resistance Comments  Nustep  Lvl 4 x 20 mins   Rockerboard 5 mins   Forward Step Ups    Standing Marches    Standing Hip Abduction    Standing Ham Curls     Blank cell = exercise not performed today   Modalities  Date:  Unattended Estim: Lumbar, IFC 80-150 hz, 15 mins, Pain Hot Pack: Lumbar, 15 mins, Pain   02/19/23   EXERCISE LOG  Exercise Repetitions and Resistance Comments  Nustep Lvl 3 x 20 mins   Rockerboard 4 mins   LAQs 3# x 25 reps bil    Seated Marches 3# x 25 reps bil   Seated Hip Abduction Green x 3 mins   Seated Hip Adduction 3 mins   Seated Ham Curls Green x 25 reps bil   STS 10 reps    Blank cell = exercise not performed today    02/16/23:  Nustep level 4 x 20 minutes f/b left femoral traction with oscillations x 5 minutes f/b HMP and IFC at 80-150 Hz on 40% scan x 20 minutes to patient's left low back/SIJ region.  Normal modality response following removal of modality.                       02/09/23              EXERCISE LOG  Exercise Repetitions and Resistance Comments  Nustep Lvl 3 x 20 mins    Rockerboard 4 mins   LAQs 2# x 30 reps bil    Seated Marches 2# x 30 reps bil   Seated Hip Abduction Red x 3 mins   Seated Hip Adduction    Seated Ham Curls Red x 30 reps bil   STS     Blank cell = exercise not performed today   Modalities  Date:  Unattended Estim: Lumbar, IFC 80-150 Hz, 15 mins, Pain Hot Pack: Lumbar, 15 mins, Pain and Tone  PATIENT EDUCATION:  Education details:  Person educated:  International aid/development worker:  Education comprehension:   HOME EXERCISE PROGRAM:   ASSESSMENT:  CLINICAL IMPRESSION: Pt arrives for today's treatment session denying any pain.  Pt reports that she "feels 17 again."  Pt  has met all the goals set forth for her by physical therapy at this time.  Pt given print out for home TENS unit and encouraged to come by facility if she needs assistance with setting up her unit.  Pt encouraged to call the facility with any questions or concerns.  Pt denied any pain at completion of today's treatment session.  Pt ready for discharge at this time.   OBJECTIVE IMPAIRMENTS: decreased activity tolerance, increased muscle spasms, and pain.   ACTIVITY LIMITATIONS: carrying, lifting, bending, and standing  PARTICIPATION LIMITATIONS: meal prep, cleaning, and laundry  PERSONAL FACTORS: 1 comorbidity: h/o DDD  are also affecting patient's functional outcome.   REHAB POTENTIAL: Good  CLINICAL DECISION MAKING: Evolving/moderate complexity  EVALUATION COMPLEXITY: Low   GOALS:  LONG TERM GOALS: Target date: 03/09/23  Ind with a HEP.  Goal status: IN PROGRESS  2.  Eliminate left LE pain.  Goal status: MET  3.  Perform ADL's with pain not > 3/10.   1/14: "mostly days";  Goal status: MET  PLAN:  PT FREQUENCY: 2x/week  PT DURATION: 6 weeks  PLANNED INTERVENTIONS: 97110-Therapeutic exercises, 97530- Therapeutic activity, 97112- Neuromuscular re-education, 97535- Self Care, 62952- Manual therapy, 97014- Electrical stimulation (unattended), 97035-  Ultrasound, 84132- Traction (mechanical), Patient/Family education, Dry Needling, Cryotherapy, and Moist heat.  PLAN FOR NEXT SESSION: Combo e'stim/US, STW/M, Core exercise progression, spinal protection   [name], PTA 03/10/2023, 4:43 PM

## 2023-03-11 ENCOUNTER — Other Ambulatory Visit: Payer: Self-pay | Admitting: Family Medicine

## 2023-03-11 DIAGNOSIS — E782 Mixed hyperlipidemia: Secondary | ICD-10-CM

## 2023-03-25 ENCOUNTER — Other Ambulatory Visit: Payer: Self-pay | Admitting: Family Medicine

## 2023-03-25 DIAGNOSIS — E782 Mixed hyperlipidemia: Secondary | ICD-10-CM

## 2023-03-25 NOTE — Telephone Encounter (Signed)
Spoke with pt informed her she needs appt for refills on atorvastatin, Pt said she was at work and will call back to schedule appt.

## 2023-03-25 NOTE — Telephone Encounter (Signed)
Angela Newman pt NTBS 30-d given 03/11/23

## 2023-06-18 ENCOUNTER — Other Ambulatory Visit: Payer: Self-pay | Admitting: Family Medicine

## 2023-06-18 DIAGNOSIS — K5909 Other constipation: Secondary | ICD-10-CM

## 2023-12-24 ENCOUNTER — Ambulatory Visit: Admitting: Podiatry

## 2023-12-24 DIAGNOSIS — M722 Plantar fascial fibromatosis: Secondary | ICD-10-CM

## 2023-12-24 MED ORDER — TRIAMCINOLONE ACETONIDE 40 MG/ML IJ SUSP
40.0000 mg | Freq: Once | INTRAMUSCULAR | Status: AC
  Administered 2023-12-24: 40 mg

## 2023-12-24 NOTE — Progress Notes (Signed)
 Patient presents with complaint of pain at the heel on the right.  Hurts when she or stands in the morning.  She had this about a year ago and had an injection and did well with that.  I does not recall any injury to the foot.  Has not noticed any redness or swelling.   Physical exam:  General appearance: Pleasant, and in no acute distress. AOx3.  Vascular: Pedal pulses: DP 2/4 bilaterally, PT 2/4 bilaterally.  Mild to moderate edema lower legs bilaterally. Capillary fill time immediate bilaterally.  Neurological: Light touch intact feet bilaterally.  Normal Achilles reflex bilaterally.  No clonus or spasticity noted.  Negative Tinel sign tarsal tunnel porta pedis bilaterally  Dermatologic:   Skin normal temperature bilaterally.  Skin normal color, tone, and texture bilaterally.   Musculoskeletal: Tenderness plantar medial aspect of the heel at the medial plantar calcaneal tubercle right.  No tenderness with lateral compression of the calcaneus.  No tenderness at the posterior aspect of the heel or the Achilles tendon.   Diagnosis: 1.  Plantar fasciitis right  Plan: -Discussed continued pain at the heel we will  injection today.  Discussed proper shoes and exercises she can do. -injected 3cc 2:1 mixture 0.5 cc Marcaine:Kenolog 10mg /56ml at plantar medial origin plantar fascia at medial plantar calcaneal tubercle right.    Return 2 weeks follow-up injection plantar fascia right

## 2024-01-12 ENCOUNTER — Ambulatory Visit: Admitting: Podiatry
